# Patient Record
Sex: Female | Born: 1985 | Race: Black or African American | Hispanic: No | Marital: Married | Smoking: Never smoker
Health system: Southern US, Community
[De-identification: ages and names within clinical notes are randomized; demographics above are authoritative.]

## PROBLEM LIST (undated history)

## (undated) ENCOUNTER — Inpatient Hospital Stay (HOSPITAL_COMMUNITY): Payer: Self-pay

## (undated) DIAGNOSIS — R87629 Unspecified abnormal cytological findings in specimens from vagina: Secondary | ICD-10-CM

## (undated) DIAGNOSIS — Z789 Other specified health status: Secondary | ICD-10-CM

## (undated) DIAGNOSIS — IMO0002 Reserved for concepts with insufficient information to code with codable children: Secondary | ICD-10-CM

## (undated) DIAGNOSIS — N390 Urinary tract infection, site not specified: Secondary | ICD-10-CM

## (undated) DIAGNOSIS — R87619 Unspecified abnormal cytological findings in specimens from cervix uteri: Secondary | ICD-10-CM

---

## 2007-04-26 ENCOUNTER — Emergency Department (HOSPITAL_COMMUNITY): Admission: EM | Admit: 2007-04-26 | Discharge: 2007-04-27 | Payer: Self-pay | Admitting: Emergency Medicine

## 2008-05-19 ENCOUNTER — Inpatient Hospital Stay (HOSPITAL_COMMUNITY): Admission: AD | Admit: 2008-05-19 | Discharge: 2008-05-19 | Payer: Self-pay | Admitting: Obstetrics

## 2008-05-23 ENCOUNTER — Inpatient Hospital Stay (HOSPITAL_COMMUNITY): Admission: AD | Admit: 2008-05-23 | Discharge: 2008-05-23 | Payer: Self-pay | Admitting: Obstetrics

## 2008-06-11 ENCOUNTER — Inpatient Hospital Stay (HOSPITAL_COMMUNITY): Admission: RE | Admit: 2008-06-11 | Discharge: 2008-06-13 | Payer: Self-pay | Admitting: Obstetrics

## 2010-02-08 DIAGNOSIS — N390 Urinary tract infection, site not specified: Secondary | ICD-10-CM

## 2010-02-08 HISTORY — DX: Urinary tract infection, site not specified: N39.0

## 2010-05-19 LAB — CBC
HCT: 36.5 % (ref 36.0–46.0)
Hemoglobin: 12.4 g/dL (ref 12.0–15.0)
MCHC: 34.1 g/dL (ref 30.0–36.0)
MCHC: 34.1 g/dL (ref 30.0–36.0)
MCV: 95.8 fL (ref 78.0–100.0)
MCV: 96 fL (ref 78.0–100.0)
Platelets: 196 10*3/uL (ref 150–400)
RDW: 14.1 % (ref 11.5–15.5)

## 2010-06-23 ENCOUNTER — Emergency Department (HOSPITAL_BASED_OUTPATIENT_CLINIC_OR_DEPARTMENT_OTHER)
Admission: EM | Admit: 2010-06-23 | Discharge: 2010-06-23 | Disposition: A | Discharge status: Home / Self Care | Payer: 59 | Attending: Emergency Medicine | Admitting: Emergency Medicine

## 2010-06-23 ENCOUNTER — Emergency Department (INDEPENDENT_AMBULATORY_CARE_PROVIDER_SITE_OTHER): Payer: 59

## 2010-06-23 DIAGNOSIS — N390 Urinary tract infection, site not specified: Secondary | ICD-10-CM | POA: Insufficient documentation

## 2010-06-23 DIAGNOSIS — R1032 Left lower quadrant pain: Secondary | ICD-10-CM

## 2010-06-23 DIAGNOSIS — O2390 Unspecified genitourinary tract infection in pregnancy, unspecified trimester: Secondary | ICD-10-CM | POA: Insufficient documentation

## 2010-06-23 DIAGNOSIS — O2690 Pregnancy related conditions, unspecified, unspecified trimester: Secondary | ICD-10-CM | POA: Insufficient documentation

## 2010-06-23 LAB — CBC
HCT: 33.2 % — ABNORMAL LOW (ref 36.0–46.0)
Hemoglobin: 11.5 g/dL — ABNORMAL LOW (ref 12.0–15.0)
MCHC: 34.6 g/dL (ref 30.0–36.0)
WBC: 6.2 10*3/uL (ref 4.0–10.5)

## 2010-06-23 LAB — WET PREP, GENITAL: Yeast Wet Prep HPF POC: NONE SEEN

## 2010-06-23 LAB — URINALYSIS, ROUTINE W REFLEX MICROSCOPIC
Glucose, UA: NEGATIVE mg/dL
Specific Gravity, Urine: 1.011 (ref 1.005–1.030)
pH: 6.5 (ref 5.0–8.0)

## 2010-06-23 LAB — DIFFERENTIAL
Basophils Relative: 1 % (ref 0–1)
Eosinophils Relative: 2 % (ref 0–5)
Monocytes Absolute: 0.6 10*3/uL (ref 0.1–1.0)
Neutrophils Relative %: 69 % (ref 43–77)

## 2010-06-23 LAB — URINE MICROSCOPIC-ADD ON

## 2010-06-23 LAB — BASIC METABOLIC PANEL
CO2: 23 mEq/L (ref 19–32)
Chloride: 99 mEq/L (ref 96–112)
Creatinine, Ser: 0.7 mg/dL (ref 0.4–1.2)
GFR calc Af Amer: >60 mL/min (ref >60)

## 2010-06-24 LAB — URINE CULTURE
Colony Count: 8000
Culture  Setup Time: 201205151704

## 2010-08-04 ENCOUNTER — Other Ambulatory Visit (HOSPITAL_COMMUNITY): Payer: Self-pay | Admitting: Obstetrics

## 2010-08-04 LAB — STREP B DNA PROBE: GBS: NEGATIVE

## 2010-08-05 ENCOUNTER — Other Ambulatory Visit (HOSPITAL_COMMUNITY): Payer: Self-pay | Admitting: Obstetrics

## 2010-08-05 DIAGNOSIS — O365990 Maternal care for other known or suspected poor fetal growth, unspecified trimester, not applicable or unspecified: Secondary | ICD-10-CM

## 2010-08-07 ENCOUNTER — Encounter (HOSPITAL_COMMUNITY): Payer: Self-pay

## 2010-08-07 ENCOUNTER — Other Ambulatory Visit (HOSPITAL_COMMUNITY): Payer: Self-pay | Admitting: Obstetrics

## 2010-08-07 ENCOUNTER — Ambulatory Visit (HOSPITAL_COMMUNITY)
Admission: RE | Admit: 2010-08-07 | Discharge: 2010-08-07 | Disposition: A | Discharge status: Home / Self Care | Payer: 59 | Source: Ambulatory Visit | Attending: Obstetrics | Admitting: Obstetrics

## 2010-08-07 DIAGNOSIS — Z3689 Encounter for other specified antenatal screening: Secondary | ICD-10-CM | POA: Insufficient documentation

## 2010-08-07 DIAGNOSIS — O2690 Pregnancy related conditions, unspecified, unspecified trimester: Secondary | ICD-10-CM

## 2010-08-07 DIAGNOSIS — O365990 Maternal care for other known or suspected poor fetal growth, unspecified trimester, not applicable or unspecified: Secondary | ICD-10-CM | POA: Insufficient documentation

## 2010-08-16 ENCOUNTER — Inpatient Hospital Stay (HOSPITAL_COMMUNITY): Admission: AD | Admit: 2010-08-16 | Payer: 59 | Source: Ambulatory Visit | Admitting: Obstetrics

## 2010-08-16 ENCOUNTER — Inpatient Hospital Stay (HOSPITAL_COMMUNITY)
Admission: AD | Admit: 2010-08-16 | Discharge: 2010-08-16 | Disposition: A | Discharge status: Home Health | Payer: 59 | Source: Ambulatory Visit | Attending: Obstetrics | Admitting: Obstetrics

## 2010-08-16 ENCOUNTER — Encounter (HOSPITAL_COMMUNITY): Payer: Self-pay | Admitting: *Deleted

## 2010-08-16 DIAGNOSIS — O479 False labor, unspecified: Principal | ICD-10-CM | POA: Insufficient documentation

## 2010-08-16 HISTORY — DX: Unspecified abnormal cytological findings in specimens from cervix uteri: R87.619

## 2010-08-16 HISTORY — DX: Reserved for concepts with insufficient information to code with codable children: IMO0002

## 2010-08-16 HISTORY — DX: Urinary tract infection, site not specified: N39.0

## 2010-08-16 LAB — RPR: RPR: NONREACTIVE

## 2010-08-16 LAB — TYPE AND SCREEN: Antibody Screen: NEGATIVE

## 2010-08-25 ENCOUNTER — Inpatient Hospital Stay (HOSPITAL_COMMUNITY)
Admission: AD | Admit: 2010-08-25 | Discharge: 2010-08-27 | DRG: 775 | Disposition: A | Discharge status: Home / Self Care | Payer: 59 | Source: Ambulatory Visit | Attending: Obstetrics | Admitting: Obstetrics

## 2010-08-25 ENCOUNTER — Encounter (HOSPITAL_COMMUNITY): Payer: Self-pay | Admitting: Anesthesiology

## 2010-08-25 ENCOUNTER — Encounter (HOSPITAL_COMMUNITY): Payer: Self-pay

## 2010-08-25 ENCOUNTER — Inpatient Hospital Stay (HOSPITAL_COMMUNITY): Payer: 59 | Admitting: Anesthesiology

## 2010-08-25 DIAGNOSIS — O99892 Other specified diseases and conditions complicating childbirth: Principal | ICD-10-CM | POA: Diagnosis present

## 2010-08-25 DIAGNOSIS — Z22330 Carrier of Group B streptococcus: Secondary | ICD-10-CM

## 2010-08-25 LAB — CBC
MCHC: 33.2 g/dL (ref 30.0–36.0)
Platelets: 185 10*3/uL (ref 150–400)
RDW: 13.1 % (ref 11.5–15.5)
WBC: 7.3 10*3/uL (ref 4.0–10.5)

## 2010-08-25 LAB — RPR: RPR Ser Ql: NONREACTIVE

## 2010-08-25 MED ORDER — CITRIC ACID-SODIUM CITRATE 334-500 MG/5ML PO SOLN: 30.0000 mL | ORAL | Status: DC | PRN

## 2010-08-25 MED ORDER — TERBUTALINE SULFATE 1 MG/ML IJ SOLN: 0.2500 mg | Freq: Once | INTRAMUSCULAR | Status: DC | PRN

## 2010-08-25 MED ORDER — IBUPROFEN 600 MG PO TABS: 600.0000 mg | ORAL_TABLET | Freq: Four times a day (QID) | ORAL | Status: DC | PRN

## 2010-08-25 MED ORDER — FENTANYL 2.5 MCG/ML BUPIVACAINE 1/10 % EPIDURAL INFUSION (WH - ANES)
14.0000 mL/h | INTRAMUSCULAR | Status: DC
  Filled 2010-08-25 (×2): qty 60

## 2010-08-25 MED ORDER — FENTANYL 2.5 MCG/ML BUPIVACAINE 1/10 % EPIDURAL INFUSION (WH - ANES)
INTRAMUSCULAR | Status: DC | PRN
  Administered 2010-08-25: 14 mL/h
  Administered 2010-08-25: 14 mL/h via EPIDURAL

## 2010-08-25 MED ORDER — EPHEDRINE 5 MG/ML INJ: 10.0000 mg | INTRAVENOUS | Status: DC | PRN

## 2010-08-25 MED ORDER — DIPHENHYDRAMINE HCL 50 MG/ML IJ SOLN: 12.5000 mg | INTRAMUSCULAR | Status: DC | PRN

## 2010-08-25 MED ORDER — PHENYLEPHRINE 40 MCG/ML (10ML) SYRINGE FOR IV PUSH (FOR BLOOD PRESSURE SUPPORT)
80.0000 ug | PREFILLED_SYRINGE | INTRAVENOUS | Status: DC | PRN
  Filled 2010-08-25: qty 5

## 2010-08-25 MED ORDER — EPHEDRINE 5 MG/ML INJ
10.0000 mg | INTRAVENOUS | Status: DC | PRN
  Filled 2010-08-25: qty 4

## 2010-08-25 MED ORDER — LACTATED RINGERS IV SOLN
500.0000 mL | Freq: Once | INTRAVENOUS | Status: AC
  Administered 2010-08-25: 500 mL via INTRAVENOUS

## 2010-08-25 MED ORDER — OXYCODONE-ACETAMINOPHEN 5-325 MG PO TABS: 2.0000 | ORAL_TABLET | ORAL | Status: DC | PRN

## 2010-08-25 MED ORDER — OXYTOCIN 20 UNITS IN LACTATED RINGERS INFUSION - SIMPLE
125.0000 mL/h | Freq: Once | INTRAVENOUS | Status: DC
  Filled 2010-08-25: qty 1000

## 2010-08-25 MED ORDER — NALBUPHINE HCL 10 MG/ML IJ SOLN: 10.0000 mg | INTRAMUSCULAR | Status: DC | PRN

## 2010-08-25 MED ORDER — ACETAMINOPHEN 325 MG PO TABS: 650.0000 mg | ORAL_TABLET | ORAL | Status: DC | PRN

## 2010-08-25 MED ORDER — LACTATED RINGERS IV SOLN
INTRAVENOUS | Status: DC
  Administered 2010-08-25: 21:00:00 via INTRAVENOUS

## 2010-08-25 MED ORDER — PHENYLEPHRINE 40 MCG/ML (10ML) SYRINGE FOR IV PUSH (FOR BLOOD PRESSURE SUPPORT): 80.0000 ug | PREFILLED_SYRINGE | INTRAVENOUS | Status: DC | PRN

## 2010-08-25 MED ORDER — FLEET ENEMA 7-19 GM/118ML RE ENEM: 1.0000 | ENEMA | RECTAL | Status: DC | PRN

## 2010-08-25 MED ORDER — ONDANSETRON HCL 4 MG/2ML IJ SOLN: 4.0000 mg | Freq: Four times a day (QID) | INTRAMUSCULAR | Status: DC | PRN

## 2010-08-25 MED ORDER — LACTATED RINGERS IV SOLN: 500.0000 mL | INTRAVENOUS | Status: DC | PRN

## 2010-08-25 MED ORDER — LIDOCAINE HCL (PF) 1 % IJ SOLN
30.0000 mL | INTRAMUSCULAR | Status: DC | PRN
  Filled 2010-08-25: qty 30

## 2010-08-25 MED ORDER — OXYTOCIN 20 UNITS IN LACTATED RINGERS INFUSION - SIMPLE
1.0000 m[IU]/min | INTRAVENOUS | Status: DC
  Administered 2010-08-25: 1 m[IU]/min via INTRAVENOUS
  Filled 2010-08-25: qty 1000

## 2010-08-25 NOTE — Progress Notes (Signed)
Pt states she has been having contractions every 5-7, has been up all night. Pt states she has leaking of thick white fluid for a while.

## 2010-08-25 NOTE — Progress Notes (Signed)
Pt states ctx started last night & were 10-15 minutes apart. States they have been 5-7 minutes about this morning. Denies loss of fluid or vaginal bleeding.

## 2010-08-25 NOTE — H&P (Signed)
This is Dr. Lewis Moccasin dictating the history and physical on Cynthia Berg She's a gravida 3 para 2004 [facility] 09/11/2010 she has no known allergies she was admitted in labor negative GBS does be positive Cervix is normal at 3 cm 80% vertex -1 Membranes were ruptured artificially and the fluid was clear Past medical history negative Social history denies smoking drinking or alcohol abuse Family history noncontributory System review noncontributory Physical exam HEENT negative breasts negative Breasts negative Heart regular rhythm no murmurs no gallops Lungs clear to P&A Abdomen term Pelvic as described above Extremities negative

## 2010-08-25 NOTE — Anesthesia Preprocedure Evaluation (Signed)
Anesthesia Evaluation  Name, MR# and DOB Patient awake  General Assessment Comment  Reviewed: Allergy & Precautions, H&P , Patient's Chart, lab work & pertinent test results and reviewed documented beta blocker date and time   Airway Mallampati: I TM Distance: >3 FB Neck ROM: full    Dental  (+) Teeth Intact   Pulmonaryneg pulmonary ROS    clear to auscultation  pulmonary exam normal   Cardiovascular regular Normal   Neuro/PsychNegative Neurological ROS Negative Psych ROS  GI/Hepatic/Renal negative GI ROS, negative Liver ROS, and negative Renal ROS (+)       Endo/Other  Negative Endocrine ROS (+)   Abdominal   Musculoskeletal  Hematology negative hematology ROS (+)   Peds  Reproductive/Obstetrics (+) Pregnancy   Anesthesia Other Findings             Anesthesia Physical Anesthesia Plan  ASA: II  Anesthesia Plan: Epidural   Post-op Pain Management:    Induction:   Airway Management Planned:   Additional Equipment:   Intra-op Plan:   Post-operative Plan:   Informed Consent: I have reviewed the patients History and Physical, chart, labs and discussed the procedure including the risks, benefits and alternatives for the proposed anesthesia with the patient or authorized representative who has indicated his/her understanding and acceptance.   History available from chart only  Plan Discussed with:   Anesthesia Plan Comments:         Anesthesia Quick Evaluation

## 2010-08-25 NOTE — Progress Notes (Signed)
Notified of admission to MAU & SVE. Orders received for admission to birthing suites.

## 2010-08-25 NOTE — Anesthesia Procedure Notes (Addendum)
Epidural Patient location during procedure: OB Start time: 08/25/2010 5:40 PM End time: 08/25/2010 5:52 PM Reason for block: procedure for pain  Staffing Anesthesiologist: Sandrea Hughs Performed by: anesthesiologist   Preanesthetic Checklist Completed: patient identified, site marked, surgical consent, pre-op evaluation, timeout performed, IV checked, risks and benefits discussed and monitors and equipment checked  Epidural Patient position: sitting Prep: DuraPrep Patient monitoring: continuous pulse ox and blood pressure Approach: midline Injection technique: LOR air  Needle:  Needle type: Tuohy  Needle gauge: 17 G Needle length: 9 cm Needle insertion depth: 4 cm Catheter type: closed end flexible Catheter size: 19 Gauge Catheter at skin depth: 10 cm Test dose: negative  Assessment Sensory level: T8 Events: blood not aspirated, injection not painful, no injection resistance, negative IV test and no paresthesia  Additional Notes Pt comfortable. See nursing notes for VS and FHR

## 2010-08-26 LAB — CBC
HCT: 35.8 % — ABNORMAL LOW (ref 36.0–46.0)
Hemoglobin: 12 g/dL (ref 12.0–15.0)
WBC: 12.3 10*3/uL — ABNORMAL HIGH (ref 4.0–10.5)

## 2010-08-26 MED ORDER — OXYTOCIN 20 UNITS IN LACTATED RINGERS INFUSION - SIMPLE: 125.0000 mL/h | INTRAVENOUS | Status: DC | PRN

## 2010-08-26 MED ORDER — WITCH HAZEL-GLYCERIN EX PADS: MEDICATED_PAD | CUTANEOUS | Status: DC | PRN

## 2010-08-26 MED ORDER — ONDANSETRON HCL 4 MG/2ML IJ SOLN: 4.0000 mg | INTRAMUSCULAR | Status: DC | PRN

## 2010-08-26 MED ORDER — SODIUM CHLORIDE 0.9 % IV SOLN: 250.0000 mL | INTRAVENOUS | Status: DC

## 2010-08-26 MED ORDER — SODIUM CHLORIDE 0.9 % IJ SOLN: 3.0000 mL | Freq: Two times a day (BID) | INTRAMUSCULAR | Status: DC

## 2010-08-26 MED ORDER — FERROUS SULFATE 325 (65 FE) MG PO TABS
325.0000 mg | ORAL_TABLET | Freq: Two times a day (BID) | ORAL | Status: DC
  Administered 2010-08-26 – 2010-08-27 (×2): 325 mg via ORAL
  Filled 2010-08-26 (×2): qty 1

## 2010-08-26 MED ORDER — PRENATAL PLUS 27-1 MG PO TABS
1.0000 | ORAL_TABLET | Freq: Every day | ORAL | Status: DC
Start: 2010-08-26 — End: 2010-08-27
  Administered 2010-08-26 – 2010-08-27 (×2): 1 via ORAL
  Filled 2010-08-26 (×2): qty 1

## 2010-08-26 MED ORDER — BENZOCAINE-MENTHOL 20-0.5 % EX AERO
1.0000 "application " | INHALATION_SPRAY | CUTANEOUS | Status: DC | PRN
  Administered 2010-08-26: 1 via TOPICAL

## 2010-08-26 MED ORDER — IBUPROFEN 600 MG PO TABS
600.0000 mg | ORAL_TABLET | Freq: Four times a day (QID) | ORAL | Status: DC
  Administered 2010-08-26 – 2010-08-27 (×6): 600 mg via ORAL
  Filled 2010-08-26 (×6): qty 1

## 2010-08-26 MED ORDER — ZOLPIDEM TARTRATE 5 MG PO TABS: 5.0000 mg | ORAL_TABLET | Freq: Every evening | ORAL | Status: DC | PRN

## 2010-08-26 MED ORDER — DIPHENHYDRAMINE HCL 25 MG PO CAPS: 25.0000 mg | ORAL_CAPSULE | Freq: Four times a day (QID) | ORAL | Status: DC | PRN

## 2010-08-26 MED ORDER — LANOLIN HYDROUS EX OINT: TOPICAL_OINTMENT | CUTANEOUS | Status: DC | PRN

## 2010-08-26 MED ORDER — SODIUM CHLORIDE 0.9 % IJ SOLN: 3.0000 mL | INTRAMUSCULAR | Status: DC | PRN

## 2010-08-26 MED ORDER — BENZOCAINE-MENTHOL 20-0.5 % EX AERO
INHALATION_SPRAY | CUTANEOUS | Status: AC
  Administered 2010-08-26: 1 via TOPICAL
  Filled 2010-08-26: qty 56

## 2010-08-26 MED ORDER — TETANUS-DIPHTH-ACELL PERTUSSIS 5-2.5-18.5 LF-MCG/0.5 IM SUSP
0.5000 mL | Freq: Once | INTRAMUSCULAR | Status: AC
  Administered 2010-08-26: 0.5 mL via INTRAMUSCULAR
  Filled 2010-08-26: qty 0.5

## 2010-08-26 MED ORDER — SENNOSIDES-DOCUSATE SODIUM 8.6-50 MG PO TABS
1.0000 | ORAL_TABLET | Freq: Every day | ORAL | Status: DC
  Administered 2010-08-26: 2 via ORAL

## 2010-08-26 MED ORDER — SIMETHICONE 80 MG PO CHEW: 80.0000 mg | CHEWABLE_TABLET | ORAL | Status: DC | PRN

## 2010-08-26 MED ORDER — OXYCODONE-ACETAMINOPHEN 5-325 MG PO TABS
1.0000 | ORAL_TABLET | ORAL | Status: DC | PRN
  Administered 2010-08-26 (×3): 1 via ORAL
  Filled 2010-08-26 (×3): qty 1

## 2010-08-26 MED ORDER — ONDANSETRON HCL 4 MG PO TABS: 4.0000 mg | ORAL_TABLET | ORAL | Status: DC | PRN

## 2010-08-26 NOTE — Progress Notes (Signed)
  Postpartum day one Vital signs normal Fundus firm Lochia moderate Legs negative No complaints 

## 2010-08-26 NOTE — Progress Notes (Signed)
BREASTFEEDING CONSULTATION SERVICES INFORMATION GIVEN TO PATIENT.  PATIENT BREASTFEEDING WITH LATCH SCORE 9 OBSERVED.  REVIEWED BASICS. PATIENT STATES SHE IS WORRIED SHE DOESN'T HAVE ANY MILK BECAUSE NONE SEEN WITH ELECTRIC BREAST PUMP.  REASSURANCE GIVEN.  DISCOURAGED USE OF PUMP AT THIS TIME BECAUSE NEWBORN IN NURSING SO WELL.  ENCOURAGED TO CALL WITH QUESTIONS OR CONCERNS.

## 2010-08-26 NOTE — Anesthesia Postprocedure Evaluation (Signed)
Vital signs stable Patient alert Pain and nausea are controlled No apparent anesthetic complications No follow up care needed Pt may be d/c when NM fxn of LE returns

## 2010-08-27 MED ORDER — IBUPROFEN 600 MG PO TABS: 600.0000 mg | ORAL_TABLET | Freq: Four times a day (QID) | ORAL | Status: AC

## 2010-08-27 NOTE — Discharge Summary (Signed)
Obstetric Discharge Summary Reason for Admission: onset of labor Prenatal Procedures: ultrasound Intrapartum Procedures: spontaneous vaginal delivery Postpartum Procedures: none Complications-Operative and Postpartum: none  Hemoglobin  Date Value Range Status  08/26/2010 12.0  12.0-15.0 (g/dL) Final     HCT  Date Value Range Status  08/26/2010 35.8* 36.0-46.0 (%) Final    Discharge Diagnoses: Term Pregnancy-delivered  Discharge Information: Date: 08/27/2010 Activity: unrestricted Diet: routine Medications: PNV and Ibuprophen Condition: stable Instructions: refer to practice specific booklet Discharge to: home Follow-up Information    Follow up with MARSHALL,BERNARD Berg. Make an appointment in 6 weeks.   Contact information:   [address] [place] [address] [phone]          Newborn Data: Live born  Information for the patient's newborn:  Cynthia, Engen Berg [[id number]]  female ; APGAR 9 - 9, ; weight ;  2761 [address]ams Home with mother.  Cynthia Berg 08/27/2010, 9:40 AM

## 2010-08-27 NOTE — Progress Notes (Signed)
Post Partum Day 2 Subjective: no complaints  Objective: Blood pressure 110/71, pulse 62, temperature 97.8 F (36.6 C), temperature source Oral, resp. rate 18, height 5\' 9"  (1.753 m), weight 72.122 kg (159 lb), SpO2 98.00%, unknown if currently breastfeeding.  Physical Exam:  General: alert Lochia: appropriate Uterine Fundus: firm Incision: healing well DVT Evaluation: No evidence of DVT seen on physical exam.   Basename 08/26/10 0515 08/25/10 1120  HGB 12.0 12.0  HCT 35.8* 36.1    Assessment/Plan: Discharge home   LOS: 2 days   Zerrick Hanssen A 08/27/2010, 9:38 AM

## 2010-08-27 NOTE — Progress Notes (Signed)
Breastfeeding  Going well infant just unlatched after .. Discussed reivewed engorgement tx if needed ,has a pump .

## 2010-12-03 ENCOUNTER — Ambulatory Visit (HOSPITAL_COMMUNITY)
Admission: RE | Admit: 2010-12-03 | Discharge: 2010-12-03 | Disposition: A | Discharge status: Home / Self Care | Payer: 59 | Source: Ambulatory Visit | Attending: Obstetrics | Admitting: Obstetrics

## 2010-12-03 NOTE — Progress Notes (Addendum)
Adult Lactation Consultation Outpatient Visit Note  Patient Name: Cynthia Berg Date of Birth: March 07, 1985  :  Mom comes in today for outpatient appointment in Lactation complaining of pain in her right nipple that started 3 weeks ago, her infant is 62 months old. She reports shooting pain in the nipple that travels into the breast primarily during and after feedings.  She states it is worse at night time.  Baby is gaining weight well, more than doubled his birth weight at 3 months.  Latch appears normal causing no discomfort.      Consultation Evaluation: Both nipples without trauma to them, no bruises, no blisters, no cracks.  Right nipple skin intact, but noted to be bright pink at the center.  Left nipple is normal brown skin color and doesn't burn or sting.  No history of antibiotics, gestational diabetes, steroid use.  Mother does state she had a vaginal yeast infection during her last trimester.  Baby's mouth clear, no diaper rash, but does pop on and off the breast more in the last 3 weeks. Discussed treating it like it is yeast.  Gave her a Audree Camel handout of Dr. Yevette Edwards protocol.  Basic yeast/thrush teaching handout given also.    No follow up needed, just prn as needed.   Judee Clara 12/03/2010, 5:03 PM   For comfort, gave mother Sore Nipple Breast Shells with instructions.  Mother does report her breast pads and bras do stay wet from leaking.  Talked about importance of drying her nipples to break the yeast cycle.

## 2010-12-31 ENCOUNTER — Emergency Department (HOSPITAL_COMMUNITY)
Admission: EM | Admit: 2010-12-31 | Discharge: 2011-01-01 | Disposition: A | Discharge status: Home / Self Care | Payer: 59 | Attending: Emergency Medicine | Admitting: Emergency Medicine

## 2010-12-31 ENCOUNTER — Encounter (HOSPITAL_COMMUNITY): Payer: Self-pay | Admitting: Emergency Medicine

## 2010-12-31 DIAGNOSIS — X58XXXA Exposure to other specified factors, initial encounter: Secondary | ICD-10-CM | POA: Insufficient documentation

## 2010-12-31 DIAGNOSIS — S8010XA Contusion of unspecified lower leg, initial encounter: Secondary | ICD-10-CM | POA: Insufficient documentation

## 2010-12-31 DIAGNOSIS — T148XXA Other injury of unspecified body region, initial encounter: Secondary | ICD-10-CM

## 2010-12-31 DIAGNOSIS — S9030XA Contusion of unspecified foot, initial encounter: Secondary | ICD-10-CM | POA: Insufficient documentation

## 2010-12-31 DIAGNOSIS — S7010XA Contusion of unspecified thigh, initial encounter: Secondary | ICD-10-CM | POA: Insufficient documentation

## 2010-12-31 DIAGNOSIS — M25476 Effusion, unspecified foot: Secondary | ICD-10-CM | POA: Insufficient documentation

## 2010-12-31 DIAGNOSIS — M25473 Effusion, unspecified ankle: Secondary | ICD-10-CM | POA: Insufficient documentation

## 2010-12-31 DIAGNOSIS — M79609 Pain in unspecified limb: Secondary | ICD-10-CM | POA: Insufficient documentation

## 2010-12-31 DIAGNOSIS — D649 Anemia, unspecified: Secondary | ICD-10-CM

## 2010-12-31 NOTE — ED Provider Notes (Signed)
History     CSN: [id number] Arrival date & time: 12/31/2010 11:24 PM   First MD Initiated Contact with Patient 12/31/10 2349      Chief Complaint  Patient presents with  . Leg Swelling    (Consider location/radiation/quality/duration/timing/severity/associated sxs/prior treatment) HPI Patient presents with complaint of bilateral lower extremity pain and bruising. She states she noticed small areas of bruising on her shin approximately 3 days ago. She states that her ankle is now swollen and the bruises have increased as well as developing a bruise on her posterior left thigh. She denies any trauma or falls. She denies any recent illnesses or fevers. She denies any history of easy bruising or bleeding in the past. She has had a C-section in 2 vaginal deliveries with no bleeding difficulties. Pain in legs is worse with palpation. Both legs are affected equally. There are no other associated symptoms. Nothing makes the symptoms improved. She has had no similar prior symptoms. Past Medical History  Diagnosis Date  . Abnormal Pap smear   . UTI (urinary tract infection) 2012  . No pertinent past medical history   . NVD (normal vaginal delivery) 08/25/2010    Past Surgical History  Procedure Date  . Cesarean section   . Cesarean section     Family History  Problem Relation Age of Onset  . Cancer Mother     History  Substance Use Topics  . Smoking status: Never Smoker   . Smokeless tobacco: Never Used  . Alcohol Use: No    OB History    Grav Para Term Preterm Abortions TAB SAB Ect Mult Living   3 3 1       3       Review of Systems ROS reviewed and otherwise negative except for mentioned in HPI  Allergies  Review of patient's allergies indicates no known allergies.  Home Medications   Current Outpatient Rx  Name Route Sig Dispense Refill  . IBUPROFEN 200 MG PO TABS Oral Take 800 mg by mouth every 6 (six) hours as needed. For pain     . PRENATAL PLUS 27-1 MG PO TABS  Oral Take 1 tablet by mouth daily.     Marland Kitchen FERROUS SULFATE 325 (65 FE) MG PO TABS Oral Take 1 tablet (325 mg total) by mouth 3 (three) times daily with meals. 30 tablet 0    BP 103/56  Pulse 58  Temp(Src) 98.4 F (36.9 C) (Oral)  Resp 18  SpO2 96%  Breastfeeding? Yes Vitals reviewed Physical Exam Physical Examination: General appearance - alert, well appearing, and in no distress Mental status - alert, oriented to person, place, and time Eyes - pupils equal and reactive, no scleral icterus, no conjunctival pallor Mouth - mucous membranes moist, pharynx normal without lesions Chest - clear to auscultation, no wheezes, rales or rhonchi, symmetric air entry Heart - normal rate, regular rhythm, normal S1, S2, no murmurs, rubs, clicks or gallops Abdomen - soft, nontender, nondistended, no masses or organomegaly Back exam - no midline tenderness, no CVA tenderness Neurological - alert, oriented, normal speech, no focal findings, strength 5/5 in extremities x 4, sensation intact in extremities Musculoskeletal - no joint tenderness, deformity, FROM of joints of lower extremities, no bony point tenderness Extremities - peripheral pulses normal, no pedal edema, no clubbing or cyanosis, no significant edema  Skin - ecchymosis noted on medial aspect of left foot, scattered small bruises over bilateral anterior tibia, approx 3cm ecchymosis on posterior left thigh, no petechiae, no bruising  or rash elsewhere  ED Course  Procedures (including critical care time)  Labs Reviewed  CBC - Abnormal; Notable for the following:    RBC 3.60 (*)    Hemoglobin 10.7 (*)    HCT 32.5 (*)    All other components within normal limits  APTT  PROTIME-INR  BASIC METABOLIC PANEL  URINALYSIS, ROUTINE W REFLEX MICROSCOPIC   No results found.   1. Bruising   2. Anemia       MDM  Patient presenting with bruises over her feet and lower extremities which have been present for 3-4 days. No significant edema or  asymmetry of her exam. No systemic symptoms, fever or vomiting. She has no known trauma. Laboratory evaluation is normal including normal platelets and coags. Patient is anemic and is taking vitamins with iron supplements. She is nontoxic and well-hydrated appearing on exam. There is no history of bleeding disorder. She was discharged with strict return precautions and is agreeable with this plan. She is to arrange followup with her primary doctor.        Ethelda Chick, MD 01/01/11 [phone]

## 2010-12-31 NOTE — ED Notes (Signed)
PT. REPORTS BILATERAL LEG PAIN / SWELLING AND NUMBNESS X 4 DAYS , DENIES INJURY OR FALL , NO FEVER OR CHILLS.

## 2011-01-01 LAB — BASIC METABOLIC PANEL
CO2: 28 mEq/L (ref 19–32)
GFR calc non Af Amer: >90 mL/min (ref >90)
Glucose, Bld: 89 mg/dL (ref 70–99)
Potassium: 3.8 mEq/L (ref 3.5–5.1)
Sodium: 139 mEq/L (ref 135–145)

## 2011-01-01 LAB — CBC
Hemoglobin: 10.7 g/dL — ABNORMAL LOW (ref 12.0–15.0)
MCH: 29.7 pg (ref 26.0–34.0)
MCV: 90.3 fL (ref 78.0–100.0)
RBC: 3.6 MIL/uL — ABNORMAL LOW (ref 3.87–5.11)

## 2011-01-01 LAB — URINALYSIS, ROUTINE W REFLEX MICROSCOPIC
Glucose, UA: NEGATIVE mg/dL
Hgb urine dipstick: NEGATIVE
Leukocytes, UA: NEGATIVE
Specific Gravity, Urine: 1.023 (ref 1.005–1.030)
Urobilinogen, UA: 1 mg/dL (ref 0.0–1.0)

## 2011-01-01 MED ORDER — FERROUS SULFATE 325 (65 FE) MG PO TABS: 325.0000 mg | ORAL_TABLET | Freq: Three times a day (TID) | ORAL | Status: DC

## 2011-01-01 NOTE — ED Notes (Signed)
MD at bedside. 

## 2011-01-01 NOTE — ED Notes (Signed)
Pt c/o pain bilateral lower extremities, and in her breast. Significant ecchymosis present in both legs. Skin cool

## 2012-12-19 IMAGING — US US OB LIMITED
1 series · 13 of 28 positions shown · non-contrast
Comparison: none

CLINICAL DATA: Lower quadrant and left flank pain with cramping for
2 days.  28 weeks estimated gestational age

[Series 1: us ob limited · 0.30mm/px · 13 of 46 slices shown]
[im 2/46]
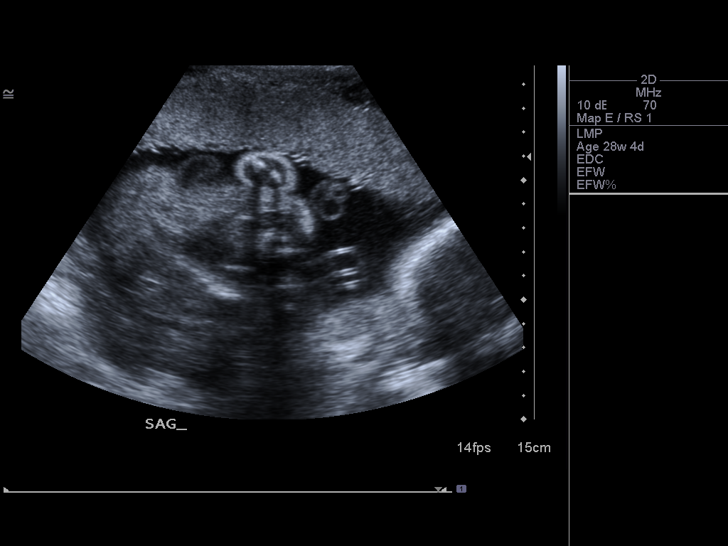
[im 6/46]
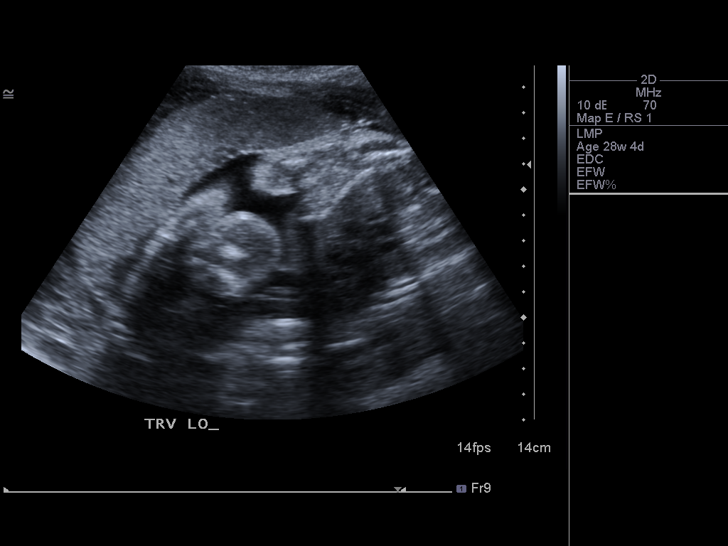
[im 9/46]
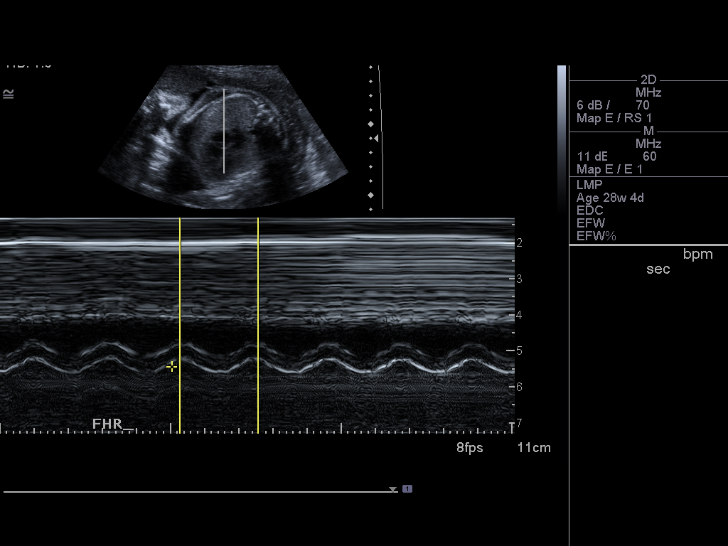
[im 12/46]
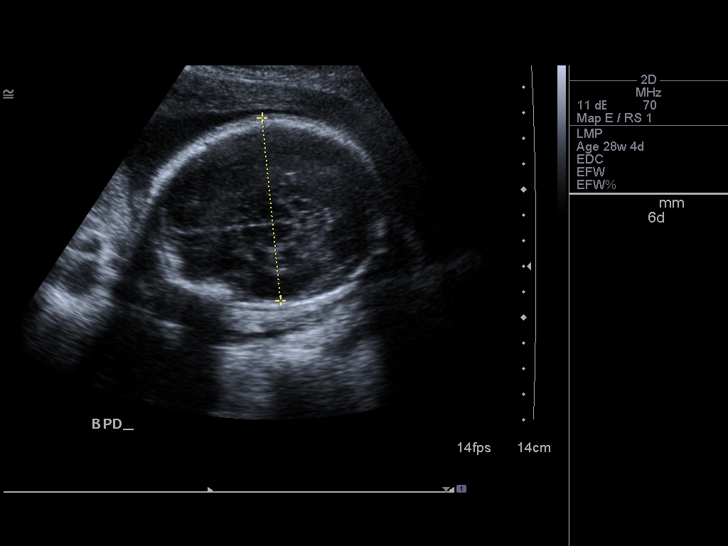
[im 16/46]
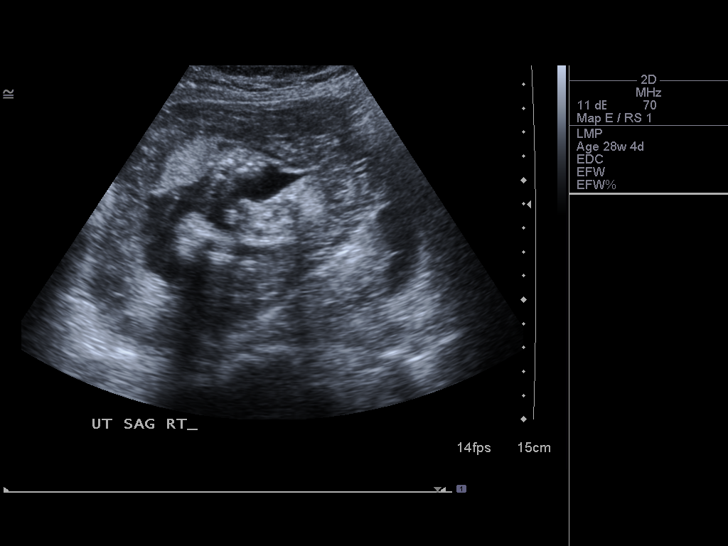
[im 19/46]
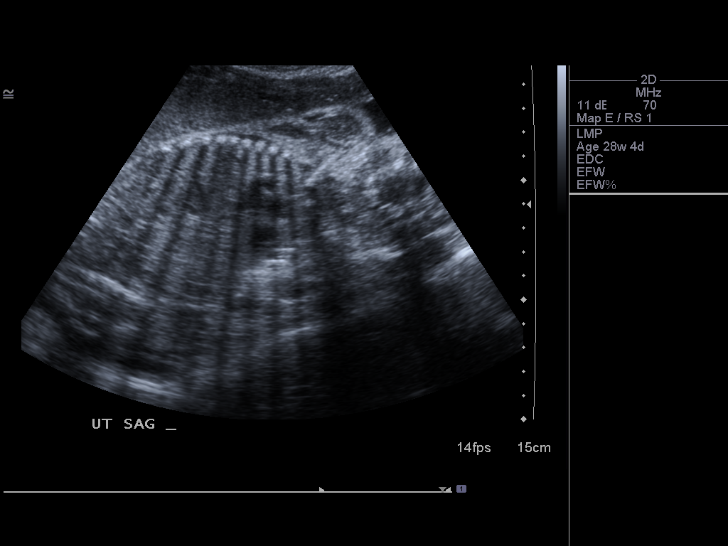
[im 24/46]
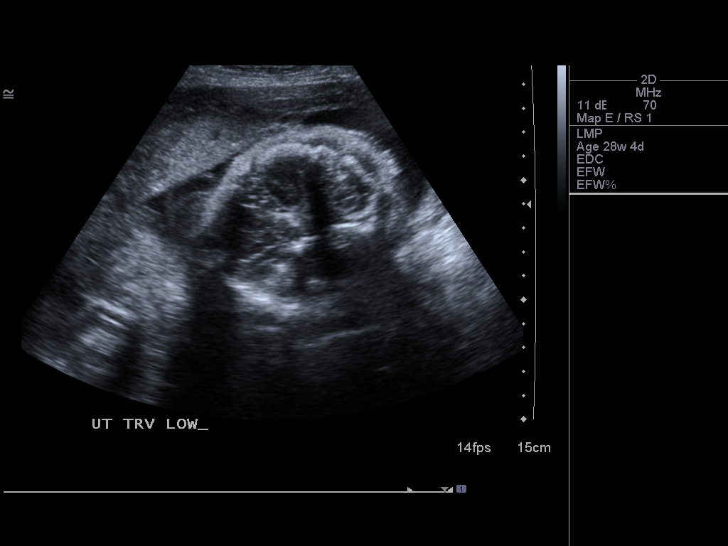
[im 27/46]
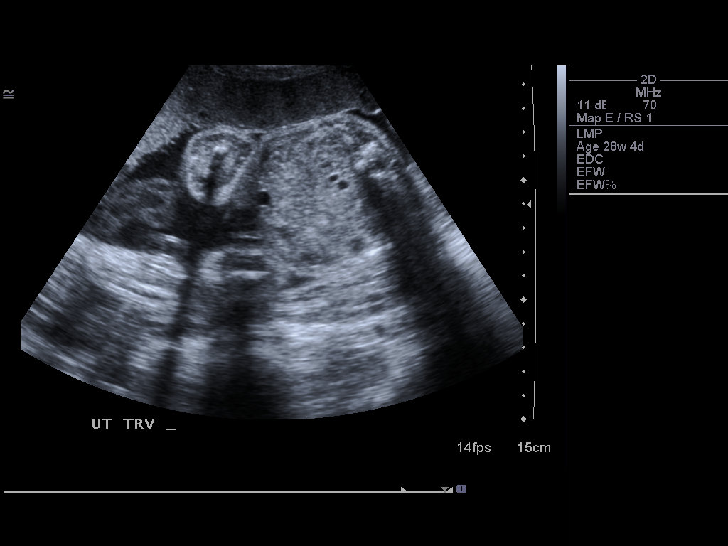
[im 31/46]
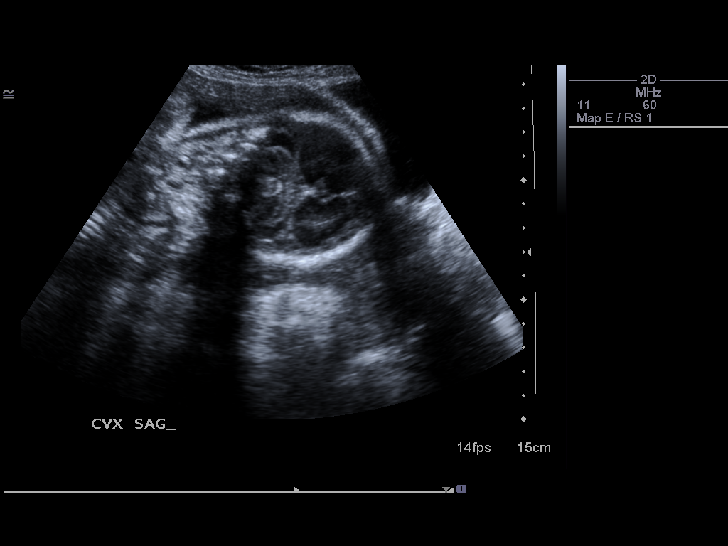
[im 34/46]
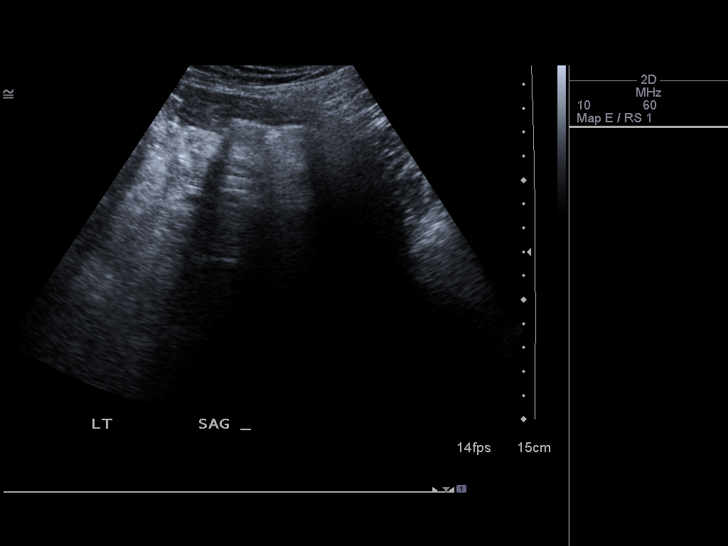
[im 37/46]
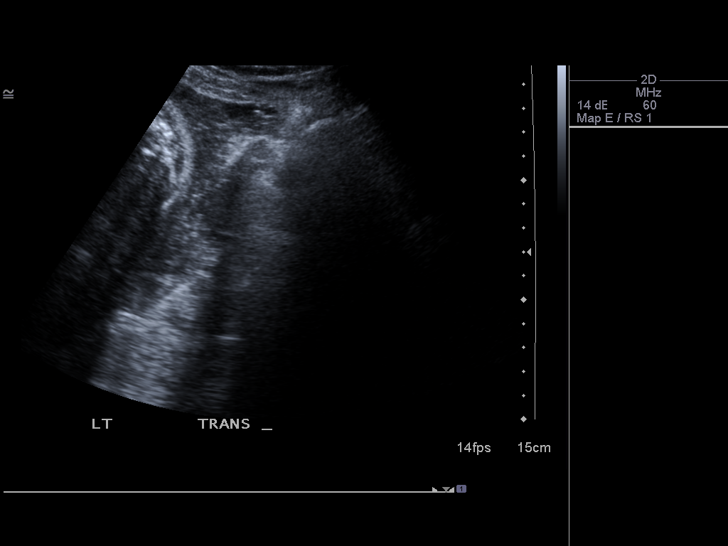
[im 41/46]
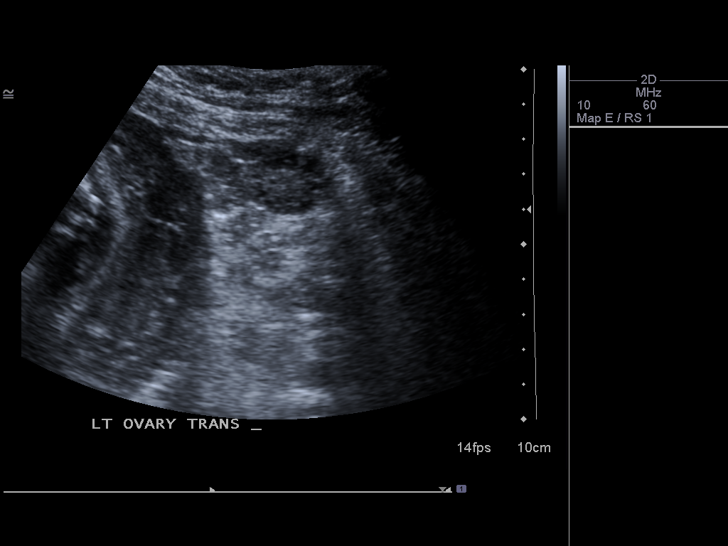
[im 44/46]
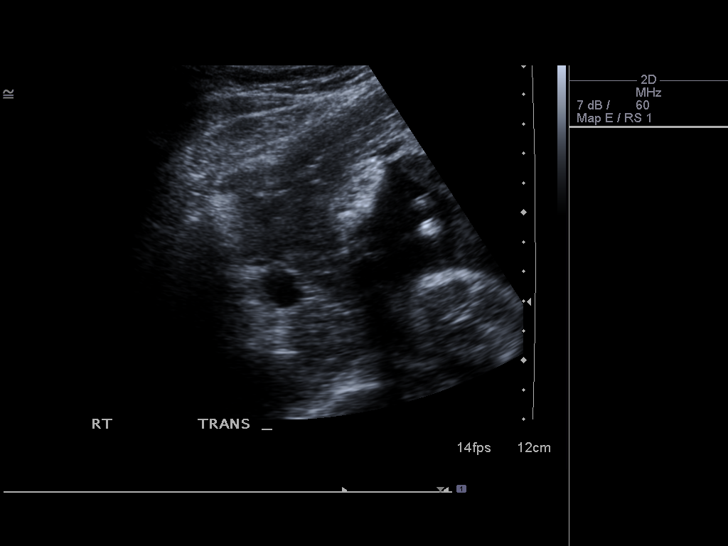

[13 of 28 positions shown; findings below may reference images not displayed]

LIMITED OBSTETRIC ULTRASOUND

Number of Fetuses: 1
Heart Rate: 484bpm
Movement: Seen
Presentation: Cephalic
Placental Location: Anterior
Previa: No
Amniotic Fluid (Subjective): Normal, AFI equals 13.4 cm (5% =
cm, 95% = 23.1 cm at 29 weeks)

BPD: 71.7cm   28w   5d

MATERNAL FINDINGS:
Cervix: 4.0, measured transabdominally/
Uterus/Adnexae: The left ovary appears normal.  The right ovary is
not visualized.

No focal placental abnormality is identified.
IMPRESSION: Single living intrauterine pregnancy demonstrating an estimated
gestational age by BPD alone of 28 weeks 5 days.  This correlates
well with expected estimated gestational age by LMP of 28 weeks 4
days and corresponding EDC of 09/11/2010.

Subjectively and quantitatively normal amniotic fluid volume.

Normal cervical length and appearance, measured transabdominally.

No focal placental abnormality.  Specifically no signs of abruption
are identified on the provided images of the placenta.

Recommend followup with non-emergent complete OB 14+ wk US
examination for fetal biometric evaluation and anatomic survey.
This could be performed at the [REDACTED].

## 2013-12-10 ENCOUNTER — Encounter (HOSPITAL_COMMUNITY): Payer: Self-pay | Admitting: Emergency Medicine

## 2014-10-23 ENCOUNTER — Encounter (HOSPITAL_COMMUNITY): Payer: Self-pay | Admitting: *Deleted

## 2014-10-23 ENCOUNTER — Inpatient Hospital Stay (HOSPITAL_COMMUNITY)
Admission: AD | Admit: 2014-10-23 | Discharge: 2014-10-23 | Disposition: A | Discharge status: Home / Self Care | Payer: Medicaid Other | Source: Ambulatory Visit | Attending: Obstetrics | Admitting: Obstetrics

## 2014-10-23 DIAGNOSIS — R102 Pelvic and perineal pain: Secondary | ICD-10-CM | POA: Diagnosis not present

## 2014-10-23 DIAGNOSIS — Z3A13 13 weeks gestation of pregnancy: Secondary | ICD-10-CM | POA: Insufficient documentation

## 2014-10-23 DIAGNOSIS — O26891 Other specified pregnancy related conditions, first trimester: Secondary | ICD-10-CM | POA: Diagnosis not present

## 2014-10-23 DIAGNOSIS — O26892 Other specified pregnancy related conditions, second trimester: Secondary | ICD-10-CM | POA: Diagnosis not present

## 2014-10-23 DIAGNOSIS — O4690 Antepartum hemorrhage, unspecified, unspecified trimester: Secondary | ICD-10-CM

## 2014-10-23 DIAGNOSIS — O4692 Antepartum hemorrhage, unspecified, second trimester: Secondary | ICD-10-CM

## 2014-10-23 HISTORY — DX: Other specified health status: Z78.9

## 2014-10-23 LAB — URINALYSIS, ROUTINE W REFLEX MICROSCOPIC
Bilirubin Urine: NEGATIVE
GLUCOSE, UA: NEGATIVE mg/dL
KETONES UR: NEGATIVE mg/dL
LEUKOCYTES UA: NEGATIVE
NITRITE: NEGATIVE
PROTEIN: NEGATIVE mg/dL
Specific Gravity, Urine: 1.02 (ref 1.005–1.030)
UROBILINOGEN UA: 0.2 mg/dL (ref 0.0–1.0)
pH: 6 (ref 5.0–8.0)

## 2014-10-23 LAB — WET PREP, GENITAL
CLUE CELLS WET PREP: NONE SEEN
TRICH WET PREP: NONE SEEN

## 2014-10-23 LAB — URINE MICROSCOPIC-ADD ON

## 2014-10-23 NOTE — Discharge Instructions (Signed)
Vaginal Bleeding During Pregnancy, First Trimester  A small amount of bleeding (spotting) from the vagina is relatively common in early pregnancy. It usually stops on its own. Various things may cause bleeding or spotting in early pregnancy. Some bleeding may be related to the pregnancy, and some may not. In most cases, the bleeding is normal and is not a problem. However, bleeding can also be a sign of something serious. Be sure to tell your health care provider about any vaginal bleeding right away.  Some possible causes of vaginal bleeding during the first trimester include:  · Infection or inflammation of the cervix.  · Growths (polyps) on the cervix.  · Miscarriage or threatened miscarriage.  · Pregnancy tissue has developed outside of the uterus and in a fallopian tube (tubal pregnancy).  · Tiny cysts have developed in the uterus instead of pregnancy tissue (molar pregnancy).  HOME CARE INSTRUCTIONS   Watch your condition for any changes. The following actions may help to lessen any discomfort you are feeling:  · Follow your health care provider's instructions for limiting your activity. If your health care provider orders bed rest, you may need to stay in bed and only get up to use the bathroom. However, your health care provider may allow you to continue light activity.  · If needed, make plans for someone to help with your regular activities and responsibilities while you are on bed rest.  · Keep track of the number of pads you use each day, how often you change pads, and how soaked (saturated) they are. Write this down.  · Do not use tampons. Do not douche.  · Do not have sexual intercourse or orgasms until approved by your health care provider.  · If you pass any tissue from your vagina, save the tissue so you can show it to your health care provider.  · Only take over-the-counter or prescription medicines as directed by your health care provider.  · Do not take aspirin because it can make you  bleed.  · Keep all follow-up appointments as directed by your health care provider.  SEEK MEDICAL CARE IF:  · You have any vaginal bleeding during any part of your pregnancy.  · You have cramps or labor pains.  · You have a fever, not controlled by medicine.  SEEK IMMEDIATE MEDICAL CARE IF:   · You have severe cramps in your back or belly (abdomen).  · You pass large clots or tissue from your vagina.  · Your bleeding increases.  · You feel light-headed or weak, or you have fainting episodes.  · You have chills.  · You are leaking fluid or have a gush of fluid from your vagina.  · You pass out while having a bowel movement.  MAKE SURE YOU:  · Understand these instructions.  · Will watch your condition.  · Will get help right away if you are not doing well or get worse.  Document Released: 11/04/2004 Document Revised: 01/30/2013 Document Reviewed: 10/02/2012  ExitCare® Patient Information ©2015 [employer]. This information is not intended to replace advice given to you by your health care provider. Make sure you discuss any questions you have with your health care provider.

## 2014-10-23 NOTE — MAU Note (Signed)
Pt reports she had a positive  HPT last week. Started having shap pain in her vaginal and pelvic area on Monday. Stated she had some brown discharge when she wiped yesterday.

## 2014-10-23 NOTE — MAU Provider Note (Signed)
History     CSN: [id number]  Arrival date and time: 10/23/14 1100   First Provider Initiated Contact with Patient 10/23/14 1133      Chief Complaint  Patient presents with  . Pelvic Pain   This is a 29 y.o. female at 106w0d by LMP who presents with c/o sharp pains in lower pelvis which shoot to vagina, intermittent, happened the other day but less so today. Also had some brown discharge. Has an appointment with Dr Gaynell Face next week for new OB.  Wanted to make sure the baby was OK   Patient is a 29 y.o. female presenting with abdominal pain. The history is provided by the patient.  Abdominal Pain The primary symptoms of the illness include abdominal pain and vaginal bleeding (brown). The primary symptoms of the illness do not include fever, fatigue, nausea, vomiting, diarrhea, dysuria or vaginal discharge. The current episode started 2 days ago. The onset of the illness was gradual.  The patient states that she believes she is currently pregnant. The patient has not had a change in bowel habit. Symptoms associated with the illness do not include chills, heartburn, constipation, urgency, hematuria, frequency or back pain.   RN Note: Pt reports she had a positive HPT last week. Started having shap pain in her vaginal and pelvic area on Monday. Stated she had some brown discharge when she wiped yesterday.          OB History    Gravida Para Term Preterm AB TAB SAB Ectopic Multiple Living   Past Medical History  Diagnosis Date  . Abnormal Pap smear   . UTI (urinary tract infection) 2012  . No pertinent past medical history   . NVD (normal vaginal delivery) 08/25/2010  . Medical history non-contributory     Past Surgical History  Procedure Laterality Date  . Cesarean section    . Cesarean section      Family History  Problem Relation Age of Onset  . Cancer Mother     Social History  Substance Use Topics  . Smoking status: Never Smoker   .  Smokeless tobacco: Never Used  . Alcohol Use: No    Allergies: No Known Allergies  Prescriptions prior to admission  Medication Sig Dispense Refill Last Dose  . ferrous sulfate 325 (65 FE) MG tablet Take 1 tablet (325 mg total) by mouth 3 (three) times daily with meals. 30 tablet 0   . ibuprofen (ADVIL,MOTRIN) 200 MG tablet Take 800 mg by mouth every 6 (six) hours as needed. For pain    12/31/2010 at Unknown  . prenatal vitamin w/FE, FA (PRENATAL 1 + 1) 27-1 MG TABS Take 1 tablet by mouth daily.    12/31/2010 at Unknown   Medical, Surgical, Family and Social histories reviewed and are listed above.  Medications and allergies reviewed.   Review of Systems  Constitutional: Negative for fever, chills and fatigue.  Gastrointestinal: Positive for abdominal pain. Negative for heartburn, nausea, vomiting, diarrhea and constipation.  Genitourinary: Positive for vaginal bleeding (brown). Negative for dysuria, urgency, frequency, hematuria and vaginal discharge.  Musculoskeletal: Negative for back pain.  Other systems negative  Physical Exam   Blood pressure 113/70, pulse 72, temperature 98.8 F (37.1 C), resp. rate 18, height  (1.753 m), weight 133 lb 9.6 oz (60.601 kg), last menstrual period 07/24/2014, currently breastfeeding.  Physical Exam  Constitutional: She is oriented to person, place,  and time. She appears well-developed and well-nourished. No distress.  HENT:  Head: Normocephalic.  Cardiovascular: Normal rate and regular rhythm.   Respiratory: Effort normal. No respiratory distress.  GI: Soft. She exhibits no distension. There is no tenderness. There is no rebound and no guarding.  Genitourinary: Vaginal discharge found.  Cervix long and closed Uterus 12 wk size, nontender White discharge in vagina  Musculoskeletal: Normal range of motion.  Neurological: She is alert and oriented to person, place, and time.  Skin: Skin is warm and dry.  Psychiatric: She has a normal  mood and affect.    MAU Course  Procedures  MDM Speculum exam done to ascertain amount of bleeding Cultures done to rule out infection as a cause of bleeding UA done to rule out cystitis Bedside US done:  Single intrauterine pregnancy, active fetus, heart rate 150s, normal appearing sac with fluid, placenta grossly normal but did not do full exam regarding [facility] status.  Results for orders placed or performed during the hospital encounter of 10/23/14 (from the past 24 hour(s))  Urinalysis, Routine w reflex microscopic (not at [facility])     Status: Abnormal   Collection Time: 10/23/14 11:25 AM  Result Value Ref Range   Color, Urine YELLOW YELLOW   APPearance CLEAR CLEAR   Specific Gravity, Urine 1.020 1.005 - 1.030   pH 6.0 5.0 - 8.0   Glucose, UA NEGATIVE NEGATIVE mg/dL   Hgb urine dipstick TRACE (A) NEGATIVE   Bilirubin Urine NEGATIVE NEGATIVE   Ketones, ur NEGATIVE NEGATIVE mg/dL   Protein, ur NEGATIVE NEGATIVE mg/dL   Urobilinogen, UA 0.2 0.0 - 1.0 mg/dL   Nitrite NEGATIVE NEGATIVE   Leukocytes, UA NEGATIVE NEGATIVE  Urine microscopic-add on     Status: None   Collection Time: 10/23/14 11:25 AM  Result Value Ref Range   Squamous Epithelial / LPF RARE RARE   WBC, UA 0-2 <3 WBC/hpf   RBC / HPF 3-6 <3 RBC/hpf   Bacteria, UA RARE RARE  Wet prep, genital     Status: Abnormal   Collection Time: 10/23/14 11:55 AM  Result Value Ref Range   Yeast Wet Prep HPF POC RARE (A) NONE SEEN   Trich, Wet Prep NONE SEEN NONE SEEN   Clue Cells Wet Prep HPF POC NONE SEEN NONE SEEN   WBC, Wet Prep HPF POC MODERATE (A) NONE SEEN    Assessment and Plan  A; Single intrauterine pregnancy      Pelvic pain in pregnancy, probably round ligament      History of brown bleeding, not evident today        Live fetus   P:  Discharge home       Reviewed results and early pregnancy evaluation       Pelvic rest       Bleeding precautions       Followup as scheduled in office for new OB visit next  week  [facility] 10/23/2014, 12:01 PM

## 2014-10-24 LAB — GC/CHLAMYDIA PROBE AMP (~~LOC~~) NOT AT ARMC
Chlamydia: NEGATIVE
NEISSERIA GONORRHEA: NEGATIVE

## 2014-11-19 LAB — OB RESULTS CONSOLE ABO/RH: RH TYPE: POSITIVE

## 2014-11-19 LAB — OB RESULTS CONSOLE GC/CHLAMYDIA
Chlamydia: NEGATIVE
Gonorrhea: NEGATIVE

## 2014-11-19 LAB — OB RESULTS CONSOLE RPR: RPR: NONREACTIVE

## 2014-11-19 LAB — OB RESULTS CONSOLE ANTIBODY SCREEN: ANTIBODY SCREEN: NEGATIVE

## 2014-11-19 LAB — OB RESULTS CONSOLE HEPATITIS B SURFACE ANTIGEN: HEP B S AG: NEGATIVE

## 2014-11-19 LAB — OB RESULTS CONSOLE HIV ANTIBODY (ROUTINE TESTING): HIV: NONREACTIVE

## 2014-11-19 LAB — OB RESULTS CONSOLE RUBELLA ANTIBODY, IGM: RUBELLA: IMMUNE

## 2015-02-13 ENCOUNTER — Inpatient Hospital Stay (HOSPITAL_COMMUNITY): Payer: Medicaid Other

## 2015-02-13 ENCOUNTER — Encounter (HOSPITAL_COMMUNITY): Payer: Self-pay

## 2015-02-13 ENCOUNTER — Inpatient Hospital Stay (HOSPITAL_COMMUNITY)
Admission: AD | Admit: 2015-02-13 | Discharge: 2015-02-13 | Disposition: A | Discharge status: Home / Self Care | Payer: Medicaid Other | Source: Ambulatory Visit | Attending: Obstetrics | Admitting: Obstetrics

## 2015-02-13 DIAGNOSIS — Z3A28 28 weeks gestation of pregnancy: Secondary | ICD-10-CM

## 2015-02-13 DIAGNOSIS — R109 Unspecified abdominal pain: Secondary | ICD-10-CM | POA: Diagnosis present

## 2015-02-13 DIAGNOSIS — O4703 False labor before 37 completed weeks of gestation, third trimester: Secondary | ICD-10-CM | POA: Diagnosis not present

## 2015-02-13 DIAGNOSIS — O471 False labor at or after 37 completed weeks of gestation: Secondary | ICD-10-CM | POA: Diagnosis not present

## 2015-02-13 DIAGNOSIS — O34219 Maternal care for unspecified type scar from previous cesarean delivery: Secondary | ICD-10-CM

## 2015-02-13 DIAGNOSIS — O368390 Maternal care for abnormalities of the fetal heart rate or rhythm, unspecified trimester, not applicable or unspecified: Secondary | ICD-10-CM

## 2015-02-13 DIAGNOSIS — O26899 Other specified pregnancy related conditions, unspecified trimester: Secondary | ICD-10-CM

## 2015-02-13 DIAGNOSIS — Z3A29 29 weeks gestation of pregnancy: Secondary | ICD-10-CM | POA: Insufficient documentation

## 2015-02-13 LAB — URINE MICROSCOPIC-ADD ON
Bacteria, UA: NONE SEEN
RBC / HPF: NONE SEEN RBC/hpf (ref 0–5)

## 2015-02-13 LAB — FETAL FIBRONECTIN: Fetal Fibronectin: NEGATIVE

## 2015-02-13 LAB — URINALYSIS, ROUTINE W REFLEX MICROSCOPIC
Bilirubin Urine: NEGATIVE
GLUCOSE, UA: NEGATIVE mg/dL
HGB URINE DIPSTICK: NEGATIVE
KETONES UR: NEGATIVE mg/dL
Nitrite: NEGATIVE
PH: 7 (ref 5.0–8.0)
PROTEIN: NEGATIVE mg/dL
Specific Gravity, Urine: 1.02 (ref 1.005–1.030)

## 2015-02-13 NOTE — MAU Note (Signed)
Pt reports she is having abd cramping not sure if they are contractions. 3-4 times today Denies bvag bleeding or discharge. Good fetal movement reported.

## 2015-02-13 NOTE — Discharge Instructions (Signed)

## 2015-02-13 NOTE — MAU Provider Note (Signed)
Chief Complaint:  Abdominal Cramping   First Provider Initiated Contact with Patient 02/13/15 1648     HPI   Cynthia Berg is a 30 y.o. [id number] at [id number] presents to maternity admissions reporting Cramping a few times per hour. Denies leaking or bleeding. She reports good fetal movement, denies LOF, vaginal bleeding, vaginal itching/burning, urinary symptoms, h/a, dizziness, n/v, diarrhea, constipation or fever/chills.  She denies headache, visual changes   RN Note: Pt reports she is having abd cramping not sure if they are contractions. 3-4 times today Denies bvag bleeding or discharge. Good fetal movement reported.        Past Medical History: Past Medical History  Diagnosis Date  . Abnormal Pap smear   . UTI (urinary tract infection) 2012  . No pertinent past medical history   . NVD (normal vaginal delivery) 08/25/2010  . Medical history non-contributory     Past obstetric history: OB History  Gravida Para Term Preterm AB SAB TAB Ectopic Multiple Living  4 3 1 2      3     # Outcome Date GA Lbr Len/2nd Weight Sex Delivery Anes PTL Lv  4 Current           3 Term 08/25/10 [id number] 11:55 / 01:29 2.761 kg (6 lb 1.4 oz) M Vag-Spont EPI  Y  2 Preterm 2010   2.892 kg (6 lb 6 oz) F VBAC EPI N Y  1 Preterm 2008   3.345 kg (7 lb 6 oz) M CS-LTranv Spinal N Y     Comments: breech      Past Surgical History: Past Surgical History  Procedure Laterality Date  . Cesarean section      C/S x 1    Family History: Family History  Problem Relation Age of Onset  . Cancer Mother     Social History: Social History  Substance Use Topics  . Smoking status: Never Smoker   . Smokeless tobacco: Never Used  . Alcohol Use: No    Allergies: No Known Allergies  Meds:  Prescriptions prior to admission  Medication Sig Dispense Refill Last Dose  . prenatal vitamin w/FE, FA (PRENATAL 1 + 1) 27-1 MG TABS Take 1 tablet by mouth daily.    02/13/2015 at Unknown time    I have reviewed  patient's Past Medical Hx, Surgical Hx, Family Hx, Social Hx, medications and allergies.   ROS:  Review of Systems  Gastrointestinal: Positive for abdominal pain. Negative for nausea, vomiting, diarrhea and constipation.  Genitourinary: Positive for pelvic pain. Negative for vaginal bleeding, vaginal discharge and vaginal pain.  Musculoskeletal: Negative for myalgias and back pain.  Other systems negative  Physical Exam  Patient Vitals for the past 24 hrs:  BP Temp Temp src Pulse Resp Height Weight  02/13/15 1622 117/81 mmHg 98.6 F (37 C) Oral 113 18 5\' 9"  (1.753 m) 70.852 kg (156 lb 3.2 oz)   Constitutional: Well-developed, well-nourished female in no acute distress.  Cardiovascular: normal rate and rhythm Respiratory: normal effort, clear to auscultate bilaterally GI: Abd soft, non-tender, gravid appropriate for gestational age.   No rebound or guarding. MS: Extremities nontender, no edema, normal ROM Neurologic: Alert and oriented x 4.  GU: Neg CVAT.  PELVIC EXAM: Cervix pink, visually closed, without lesion, scant white creamy discharge, vaginal walls and external genitalia normal  Dilation: Fingertip Effacement (%): 20 Presentation: Vertex Exam by:: Wynelle BourgeoisMarie Reily Treloar CNM   FHT:  Baseline 140 , moderate variability, accelerations present, no decelerations Contractions:  Irregular    Labs: Results for orders placed or performed during the hospital encounter of 02/13/15 (from the past 24 hour(s))  Urinalysis, Routine w reflex microscopic (not at [facility])     Status: Abnormal   Collection Time: 02/13/15  4:30 PM  Result Value Ref Range   Color, Urine YELLOW YELLOW   APPearance CLEAR CLEAR   Specific Gravity, Urine 1.020 1.005 - 1.030   pH 7.0 5.0 - 8.0   Glucose, UA NEGATIVE NEGATIVE mg/dL   Hgb urine dipstick NEGATIVE NEGATIVE   Bilirubin Urine NEGATIVE NEGATIVE   Ketones, ur NEGATIVE NEGATIVE mg/dL   Protein, ur NEGATIVE NEGATIVE mg/dL   Nitrite NEGATIVE NEGATIVE    Leukocytes, UA SMALL (A) NEGATIVE  Urine microscopic-add on     Status: Abnormal   Collection Time: 02/13/15  4:30 PM  Result Value Ref Range   Squamous Epithelial / LPF 0-5 (A) NONE SEEN   WBC, UA 0-5 0 - 5 WBC/hpf   RBC / HPF NONE SEEN 0 - 5 RBC/hpf   Bacteria, UA NONE SEEN NONE SEEN  Fetal fibronectin     Status: None   Collection Time: 02/13/15  5:07 PM  Result Value Ref Range   Fetal Fibronectin NEGATIVE NEGATIVE      Imaging:  No results found.  MAU Course/MDM: I have ordered labs and reviewed results.   >>>  FFn Negative NST reviewed Consult Dr Gaynell Face with presentation, exam findings and test results.  Treatments in MAU included   BPP done due to variable deceleration  >>>  8/8 with cervical length 3.2cm   Pt stable at time of discharge.   Assessment: 1. [redacted] weeks gestation of pregnancy   2. Fetal heart rate decelerations affecting management of mother   3.  Preterm Braxton Hicks contractions with no cervical change  Plan: Discharge home Preterm Labor precautions and fetal kick counts Follow up in Office for prenatal visits and recheck    Medication List    ASK your doctor about these medications        prenatal vitamin w/FE, FA 27-1 MG Tabs tablet  Take 1 tablet by mouth daily.        Wynelle Bourgeois CNM, MSN Certified Nurse-Midwife 02/13/2015 6:19 PM

## 2015-04-07 ENCOUNTER — Encounter (HOSPITAL_COMMUNITY): Payer: Self-pay | Admitting: *Deleted

## 2015-04-07 ENCOUNTER — Inpatient Hospital Stay (HOSPITAL_COMMUNITY)
Admission: AD | Admit: 2015-04-07 | Discharge: 2015-04-07 | Disposition: A | Discharge status: Home / Self Care | Payer: Medicaid Other | Source: Ambulatory Visit | Attending: Obstetrics | Admitting: Obstetrics

## 2015-04-07 DIAGNOSIS — O479 False labor, unspecified: Secondary | ICD-10-CM | POA: Diagnosis not present

## 2015-04-07 DIAGNOSIS — Z3A36 36 weeks gestation of pregnancy: Secondary | ICD-10-CM | POA: Insufficient documentation

## 2015-04-07 NOTE — Discharge Instructions (Signed)
Braxton Hicks Contractions °Contractions of the uterus can occur throughout pregnancy. Contractions are not always a sign that you are in labor.  °WHAT ARE BRAXTON HICKS CONTRACTIONS?  °Contractions that occur before labor are called Braxton Hicks contractions, or false labor. Toward the end of pregnancy (32-34 weeks), these contractions can develop more often and may become more forceful. This is not true labor because these contractions do not result in opening (dilatation) and thinning of the cervix. They are sometimes difficult to tell apart from true labor because these contractions can be forceful and people have different pain tolerances. You should not feel embarrassed if you go to the hospital with false labor. Sometimes, the only way to tell if you are in true labor is for your health care provider to look for changes in the cervix. °If there are no prenatal problems or other health problems associated with the pregnancy, it is completely safe to be sent home with false labor and await the onset of true labor. °HOW CAN YOU TELL THE DIFFERENCE BETWEEN TRUE AND FALSE LABOR? °False Labor °· The contractions of false labor are usually shorter and not as hard as those of true labor.   °· The contractions are usually irregular.   °· The contractions are often felt in the front of the lower abdomen and in the groin.   °· The contractions may go away when you walk around or change positions while lying down.   °· The contractions get weaker and are shorter lasting as time goes on.   °· The contractions do not usually become progressively stronger, regular, and closer together as with true labor.   °True Labor °· Contractions in true labor last 30-70 seconds, become very regular, usually become more intense, and increase in frequency.   °· The contractions do not go away with walking.   °· The discomfort is usually felt in the top of the uterus and spreads to the lower abdomen and low back.   °· True labor can be  determined by your health care provider with an exam. This will show that the cervix is dilating and getting thinner.   °WHAT TO REMEMBER °· Keep up with your usual exercises and follow other instructions given by your health care provider.   °· Take medicines as directed by your health care provider.   °· Keep your regular prenatal appointments.   °· Eat and drink lightly if you think you are going into labor.   °· If Braxton Hicks contractions are making you uncomfortable:   °¨ Change your position from lying down or resting to walking, or from walking to resting.   °¨ Sit and rest in a tub of warm water.   °¨ Drink 2-3 glasses of water. Dehydration may cause these contractions.   °¨ Do slow and deep breathing several times an hour.   °WHEN SHOULD I SEEK IMMEDIATE MEDICAL CARE? °Seek immediate medical care if: °· Your contractions become stronger, more regular, and closer together.   °· You have fluid leaking or gushing from your vagina.   °· You have a fever.   °· You pass blood-tinged mucus.   °· You have vaginal bleeding.   °· You have continuous abdominal pain.   °· You have low back pain that you never had before.   °· You feel your baby's head pushing down and causing pelvic pressure.   °· Your baby is not moving as much as it used to.   °  °This information is not intended to replace advice given to you by your health care provider. Make sure you discuss any questions you have with your health care   provider. °  °Document Released: 01/25/2005 Document Revised: 01/30/2013 Document Reviewed: 11/06/2012 °Elsevier Interactive Patient Education ©2016 [employer] ° °

## 2015-04-07 NOTE — MAU Note (Signed)
Pt reports frequent contractions and pressure in her vagina today. Denies bleeding or ROM.

## 2015-04-07 NOTE — MAU Provider Note (Signed)
History   [id number] @ 36.5 wks in with c/o brackston hicks and pelvic pressure.  states pressure is what brought her in.   CSN: [id number]  Arrival date & time 04/07/15  2006   None     No chief complaint on file.   HPI  Past Medical History  Diagnosis Date  . Abnormal Pap smear   . UTI (urinary tract infection) 2012  . No pertinent past medical history   . NVD (normal vaginal delivery) 08/25/2010  . Medical history non-contributory     Past Surgical History  Procedure Laterality Date  . Cesarean section      C/S x 1    Family History  Problem Relation Age of Onset  . Cancer Mother     Social History  Substance Use Topics  . Smoking status: Never Smoker   . Smokeless tobacco: Never Used  . Alcohol Use: No    OB History    Gravida Para Term Preterm AB TAB SAB Ectopic Multiple Living   Review of Systems  Constitutional: Negative.   HENT: Negative.   Cardiovascular: Negative.   Gastrointestinal:       Pelvic pressure  Endocrine: Negative.   Genitourinary: Negative.   Musculoskeletal: Negative.   Skin: Negative.   Allergic/Immunologic: Negative.   Neurological: Negative.   Hematological: Negative.   Psychiatric/Behavioral: Negative.     Allergies  Review of patient's allergies indicates no known allergies.  Home Medications  No current outpatient prescriptions on file.  BP 114/61 mmHg  Pulse 75  Temp(Src) 99 F (37.2 C) (Oral)  Resp 16  Ht  (1.753 m)  Wt 166 lb 4 oz (75.411 kg)  BMI 24.54 kg/m2  SpO2 100%  LMP 07/24/2014 (Approximate)  Physical Exam  Constitutional: She is oriented to person, place, and time. She appears well-developed and well-nourished.  HENT:  Head: Normocephalic.  Eyes: Pupils are equal, round, and reactive to light.  Neck: Normal range of motion.  Cardiovascular: Normal rate, regular rhythm, normal heart sounds and intact distal pulses.   Pulmonary/Chest: Effort normal and breath sounds  normal.  Abdominal: Soft. Bowel sounds are normal.  Genitourinary: Vagina normal and uterus normal.  Musculoskeletal: Normal range of motion.  Neurological: She is alert and oriented to person, place, and time. She has normal reflexes.  Skin: Skin is warm and dry.  Psychiatric: She has a normal mood and affect. Her behavior is normal. Judgment and thought content normal.    MAU Course  Procedures (including critical care time)  Labs Reviewed - No data to display No results found.   No diagnosis found.    MDM  SVE ft/th/post/high. Will d/c home with reassurring fhr.

## 2015-04-09 LAB — OB RESULTS CONSOLE GBS: GBS: NEGATIVE

## 2015-04-15 ENCOUNTER — Inpatient Hospital Stay (HOSPITAL_COMMUNITY)
Admission: AD | Admit: 2015-04-15 | Discharge: 2015-04-15 | Disposition: A | Discharge status: Home / Self Care | Payer: Medicaid Other | Source: Ambulatory Visit | Attending: Obstetrics | Admitting: Obstetrics

## 2015-04-15 ENCOUNTER — Encounter (HOSPITAL_COMMUNITY): Payer: Self-pay

## 2015-04-15 DIAGNOSIS — Z3493 Encounter for supervision of normal pregnancy, unspecified, third trimester: Secondary | ICD-10-CM | POA: Insufficient documentation

## 2015-04-15 NOTE — MAU Note (Signed)
Pt c/o contractions every 5 minutes starting around 615pm. Pt denies bleeding and leaking of fluid. Pt states baby is moving normally.

## 2015-04-17 ENCOUNTER — Inpatient Hospital Stay (HOSPITAL_COMMUNITY)
Admission: AD | Admit: 2015-04-17 | Discharge: 2015-04-17 | Disposition: A | Discharge status: Home / Self Care | Payer: Medicaid Other | Source: Ambulatory Visit | Attending: Obstetrics | Admitting: Obstetrics

## 2015-04-17 ENCOUNTER — Encounter (HOSPITAL_COMMUNITY): Payer: Self-pay | Admitting: *Deleted

## 2015-04-17 DIAGNOSIS — Z3493 Encounter for supervision of normal pregnancy, unspecified, third trimester: Secondary | ICD-10-CM | POA: Insufficient documentation

## 2015-04-17 HISTORY — DX: Unspecified abnormal cytological findings in specimens from vagina: R87.629

## 2015-04-17 NOTE — MAU Note (Signed)
Urine in lab 

## 2015-04-17 NOTE — MAU Note (Signed)
Pt reports having ctx q 5 min. Denies vag bleeding or SROm.

## 2015-04-17 NOTE — Discharge Instructions (Signed)
Fetal Movement Counts Patient Name: __________________________________________________ Patient Due Date: ____________________ Performing a fetal movement count is highly recommended in high-risk pregnancies, but it is good for every pregnant woman to do. Your health care provider may ask you to start counting fetal movements at 28 weeks of the pregnancy. Fetal movements often increase:  After eating a full meal.  After physical activity.  After eating or drinking something sweet or cold.  At rest. Pay attention to when you feel the baby is most active. This will help you notice a pattern of your baby's sleep and wake cycles and what factors contribute to an increase in fetal movement. It is important to perform a fetal movement count at the same time each day when your baby is normally most active.  HOW TO COUNT FETAL MOVEMENTS  Find a quiet and comfortable area to sit or lie down on your left side. Lying on your left side provides the best blood and oxygen circulation to your baby.  Write down the day and time on a sheet of paper or in a journal.  Start counting kicks, flutters, swishes, rolls, or jabs in a 2-hour period. You should feel at least 10 movements within 2 hours.  If you do not feel 10 movements in 2 hours, wait 2-3 hours and count again. Look for a change in the pattern or not enough counts in 2 hours. SEEK MEDICAL CARE IF:  You feel less than 10 counts in 2 hours, tried twice.  There is no movement in over an hour.  The pattern is changing or taking longer each day to reach 10 counts in 2 hours.  You feel the baby is not moving as he or she usually does. Date: ____________ Movements: ____________ Start time: ____________ Cynthia Berg time: ____________  Date: ____________ Movements: ____________ Start time: ____________ Cynthia Berg time: ____________ Date: ____________ Movements: ____________ Start time: ____________ Cynthia Berg time: ____________ Date: ____________ Movements:  ____________ Start time: ____________ Cynthia Berg time: ____________ Date: ____________ Movements: ____________ Start time: ____________ Cynthia Berg time: ____________ Date: ____________ Movements: ____________ Start time: ____________ Cynthia Berg time: ____________ Date: ____________ Movements: ____________ Start time: ____________ Cynthia Berg time: ____________ Date: ____________ Movements: ____________ Start time: ____________ Cynthia Berg time: ____________  Date: ____________ Movements: ____________ Start time: ____________ Cynthia Berg time: ____________ Date: ____________ Movements: ____________ Start time: ____________ Cynthia Berg time: ____________ Date: ____________ Movements: ____________ Start time: ____________ Cynthia Berg time: ____________ Date: ____________ Movements: ____________ Start time: ____________ Cynthia Berg time: ____________ Date: ____________ Movements: ____________ Start time: ____________ Cynthia Berg time: ____________ Date: ____________ Movements: ____________ Start time: ____________ Cynthia Berg time: ____________ Date: ____________ Movements: ____________ Start time: ____________ Cynthia Berg time: ____________  Date: ____________ Movements: ____________ Start time: ____________ Cynthia Berg time: ____________ Date: ____________ Movements: ____________ Start time: ____________ Cynthia Berg time: ____________ Date: ____________ Movements: ____________ Start time: ____________ Cynthia Berg time: ____________ Date: ____________ Movements: ____________ Start time: ____________ Cynthia Berg time: ____________ Date: ____________ Movements: ____________ Start time: ____________ Cynthia Berg time: ____________ Date: ____________ Movements: ____________ Start time: ____________ Cynthia Berg time: ____________ Date: ____________ Movements: ____________ Start time: ____________ Cynthia Berg time: ____________  Date: ____________ Movements: ____________ Start time: ____________ Cynthia Berg time: ____________ [name] Contractions Contractions of the uterus can occur throughout  pregnancy. Contractions are not always a sign that you are in labor.  WHAT ARE BRAXTON HICKS CONTRACTIONS?  Contractions that occur before labor are called Braxton Hicks contractions, or false labor. Toward the end of pregnancy (32-34 weeks), these contractions can develop more often and may become more forceful. This is not true labor because these contractions do  not result in opening (dilatation) and thinning of the cervix. They are sometimes difficult to tell apart from true labor because these contractions can be forceful and people have different pain tolerances. You should not feel embarrassed if you go to the hospital with false labor. Sometimes, the only way to tell if you are in true labor is for your health care provider to look for changes in the cervix. If there are no prenatal problems or other health problems associated with the pregnancy, it is completely safe to be sent home with false labor and await the onset of true labor. HOW CAN YOU TELL THE DIFFERENCE BETWEEN TRUE AND FALSE LABOR? False Labor  The contractions of false labor are usually shorter and not as hard as those of true labor.   The contractions are usually irregular.   The contractions are often felt in the front of the lower abdomen and in the groin.   The contractions may go away when you walk around or change positions while lying down.   The contractions get weaker and are shorter lasting as time goes on.   The contractions do not usually become progressively stronger, regular, and closer together as with true labor.  True Labor  Contractions in true labor last 30-70 seconds, become very regular, usually become more intense, and increase in frequency.   The contractions do not go away with walking.   The discomfort is usually felt in the top of the uterus and spreads to the lower abdomen and low back.   True labor can be determined by your health care provider with an exam. This will show that the  cervix is dilating and getting thinner.  WHAT TO REMEMBER  Keep up with your usual exercises and follow other instructions given by your health care provider.   Take medicines as directed by your health care provider.   Keep your regular prenatal appointments.   Eat and drink lightly if you think you are going into labor.   If Braxton Hicks contractions are making you uncomfortable:   Change your position from lying down or resting to walking, or from walking to resting.   Sit and rest in a tub of warm water.   Drink 2-3 glasses of water. Dehydration may cause these contractions.   Do slow and deep breathing several times an hour.  WHEN SHOULD I SEEK IMMEDIATE MEDICAL CARE? Seek immediate medical care if:  Your contractions become stronger, more regular, and closer together.   You have fluid leaking or gushing from your vagina.   You have a fever.   You pass blood-tinged mucus.   You have vaginal bleeding.   You have continuous abdominal pain.   You have low back pain that you never had before.   You feel your baby's head pushing down and causing pelvic pressure.   Your baby is not moving as much as it used to.    This information is not intended to replace advice given to you by your health care provider. Make sure you discuss any questions you have with your health care provider.   Document Released: 01/25/2005 Document Revised: 01/30/2013 Document Reviewed: 11/06/2012 Elsevier Interactive Patient Education [employer]2016 [employer]    This information is not intended to replace advice given to you by your health care provider. Make sure you discuss any questions you have with your health care provider.   Document Released: 02/24/2006 Document Revised: 02/15/2014 Document Reviewed: 11/22/2011 Elsevier Interactive Patient Education [employer]

## 2015-04-17 NOTE — MAU Note (Signed)
Had regular contractions last night, went to bed.  Had a large amount of bloody mucous this morning, still contracting- though not as often or as strong.  Baby not as active this morning

## 2015-04-21 ENCOUNTER — Encounter (HOSPITAL_COMMUNITY): Payer: Self-pay | Admitting: *Deleted

## 2015-04-21 ENCOUNTER — Inpatient Hospital Stay (HOSPITAL_COMMUNITY)
Admission: AD | Admit: 2015-04-21 | Discharge: 2015-04-21 | Disposition: A | Discharge status: Home / Self Care | Payer: Medicaid Other | Source: Ambulatory Visit | Attending: Obstetrics | Admitting: Obstetrics

## 2015-04-21 DIAGNOSIS — Z3493 Encounter for supervision of normal pregnancy, unspecified, third trimester: Secondary | ICD-10-CM | POA: Insufficient documentation

## 2015-04-21 MED ORDER — OXYCODONE-ACETAMINOPHEN 5-325 MG PO TABS: 2.0000 | ORAL_TABLET | Freq: Once | ORAL | Status: DC

## 2015-04-21 NOTE — Discharge Instructions (Signed)
Fetal Movement Counts Patient Name: __________________________________________________ Patient Due Date: ____________________ Performing a fetal movement count is highly recommended in high-risk pregnancies, but it is good for every pregnant woman to do. Your health care provider may ask you to start counting fetal movements at 28 weeks of the pregnancy. Fetal movements often increase:  After eating a full meal.  After physical activity.  After eating or drinking something sweet or cold.  At rest. Pay attention to when you feel the baby is most active. This will help you notice a pattern of your baby's sleep and wake cycles and what factors contribute to an increase in fetal movement. It is important to perform a fetal movement count at the same time each day when your baby is normally most active.  HOW TO COUNT FETAL MOVEMENTS 1. Find a quiet and comfortable area to sit or lie down on your left side. Lying on your left side provides the best blood and oxygen circulation to your baby. 2. Write down the day and time on a sheet of paper or in a journal. 3. Start counting kicks, flutters, swishes, rolls, or jabs in a 2-hour period. You should feel at least 10 movements within 2 hours. 4. If you do not feel 10 movements in 2 hours, wait 2-3 hours and count again. Look for a change in the pattern or not enough counts in 2 hours. SEEK MEDICAL CARE IF:  You feel less than 10 counts in 2 hours, tried twice.  There is no movement in over an hour.  The pattern is changing or taking longer each day to reach 10 counts in 2 hours.  You feel the baby is not moving as he or she usually does. Date: ____________ Movements: ____________ Start time: ____________ Finish time: ____________  Date: ____________ Movements: ____________ Start time: ____________ Finish time: ____________ Date: ____________ Movements: ____________ Start time: ____________ Finish time: ____________ Date: ____________ Movements:  ____________ Start time: ____________ Finish time: ____________ Date: ____________ Movements: ____________ Start time: ____________ Finish time: ____________ Date: ____________ Movements: ____________ Start time: ____________ Finish time: ____________ Date: ____________ Movements: ____________ Start time: ____________ Finish time: ____________ Date: ____________ Movements: ____________ Start time: ____________ Finish time: ____________  Date: ____________ Movements: ____________ Start time: ____________ Finish time: ____________ Date: ____________ Movements: ____________ Start time: ____________ Finish time: ____________ Date: ____________ Movements: ____________ Start time: ____________ Finish time: ____________ Date: ____________ Movements: ____________ Start time: ____________ Finish time: ____________ Date: ____________ Movements: ____________ Start time: ____________ Finish time: ____________ Date: ____________ Movements: ____________ Start time: ____________ Finish time: ____________ Date: ____________ Movements: ____________ Start time: ____________ Finish time: ____________  Date: ____________ Movements: ____________ Start time: ____________ Finish time: ____________ Date: ____________ Movements: ____________ Start time: ____________ Finish time: ____________ Date: ____________ Movements: ____________ Start time: ____________ Finish time: ____________ Date: ____________ Movements: ____________ Start time: ____________ Finish time: ____________ Date: ____________ Movements: ____________ Start time: ____________ Finish time: ____________ Date: ____________ Movements: ____________ Start time: ____________ Finish time: ____________ Date: ____________ Movements: ____________ Start time: ____________ Finish time: ____________  Date: ____________ Movements: ____________ Start time: ____________ Finish time: ____________ Date: ____________ Movements: ____________ Start time: ____________ Finish  time: ____________ Date: ____________ Movements: ____________ Start time: ____________ Finish time: ____________ Date: ____________ Movements: ____________ Start time: ____________ Finish time: ____________ Date: ____________ Movements: ____________ Start time: ____________ Finish time: ____________ Date: ____________ Movements: ____________ Start time: ____________ Finish time: ____________ Date: ____________ Movements: ____________ Start time: ____________ Finish time: ____________  Date: ____________ Movements: ____________ Start time: ____________ Finish   time: ____________ Date: ____________ Movements: ____________ Start time: ____________ Finish time: ____________ Date: ____________ Movements: ____________ Start time: ____________ Finish time: ____________ Date: ____________ Movements: ____________ Start time: ____________ Finish time: ____________ Date: ____________ Movements: ____________ Start time: ____________ Finish time: ____________ Date: ____________ Movements: ____________ Start time: ____________ Finish time: ____________ Date: ____________ Movements: ____________ Start time: ____________ Finish time: ____________  Date: ____________ Movements: ____________ Start time: ____________ Finish time: ____________ Date: ____________ Movements: ____________ Start time: ____________ Finish time: ____________ Date: ____________ Movements: ____________ Start time: ____________ Finish time: ____________ Date: ____________ Movements: ____________ Start time: ____________ Finish time: ____________ Date: ____________ Movements: ____________ Start time: ____________ Finish time: ____________ Date: ____________ Movements: ____________ Start time: ____________ Finish time: ____________ Date: ____________ Movements: ____________ Start time: ____________ Finish time: ____________  Date: ____________ Movements: ____________ Start time: ____________ Finish time: ____________ Date: ____________  Movements: ____________ Start time: ____________ Finish time: ____________ Date: ____________ Movements: ____________ Start time: ____________ Finish time: ____________ Date: ____________ Movements: ____________ Start time: ____________ Finish time: ____________ Date: ____________ Movements: ____________ Start time: ____________ Finish time: ____________ Date: ____________ Movements: ____________ Start time: ____________ Finish time: ____________ Date: ____________ Movements: ____________ Start time: ____________ Finish time: ____________  Date: ____________ Movements: ____________ Start time: ____________ Finish time: ____________ Date: ____________ Movements: ____________ Start time: ____________ Finish time: ____________ Date: ____________ Movements: ____________ Start time: ____________ Finish time: ____________ Date: ____________ Movements: ____________ Start time: ____________ Finish time: ____________ Date: ____________ Movements: ____________ Start time: ____________ Finish time: ____________ Date: ____________ Movements: ____________ Start time: ____________ Finish time: ____________   This information is not intended to replace advice given to you by your health care provider. Make sure you discuss any questions you have with your health care provider.   Document Released: 02/24/2006 Document Revised: 02/15/2014 Document Reviewed: 11/22/2011 Elsevier Interactive Patient Education 2016 [employer] Braxton Hicks Contractions Contractions of the uterus can occur throughout pregnancy. Contractions are not always a sign that you are in labor.  WHAT ARE BRAXTON HICKS CONTRACTIONS?  Contractions that occur before labor are called Braxton Hicks contractions, or false labor. Toward the end of pregnancy (32-34 weeks), these contractions can develop more often and may become more forceful. This is not true labor because these contractions do not result in opening (dilatation) and thinning of  the cervix. They are sometimes difficult to tell apart from true labor because these contractions can be forceful and people have different pain tolerances. You should not feel embarrassed if you go to the hospital with false labor. Sometimes, the only way to tell if you are in true labor is for your health care provider to look for changes in the cervix. If there are no prenatal problems or other health problems associated with the pregnancy, it is completely safe to be sent home with false labor and await the onset of true labor. HOW CAN YOU TELL THE DIFFERENCE BETWEEN TRUE AND FALSE LABOR? False Labor  The contractions of false labor are usually shorter and not as hard as those of true labor.   The contractions are usually irregular.   The contractions are often felt in the front of the lower abdomen and in the groin.   The contractions may go away when you walk around or change positions while lying down.   The contractions get weaker and are shorter lasting as time goes on.   The contractions do not usually become progressively stronger, regular, and closer together as with true labor.  True Labor 5. Contractions in true   labor last 30-70 seconds, become very regular, usually become more intense, and increase in frequency.  6. The contractions do not go away with walking.  7. The discomfort is usually felt in the top of the uterus and spreads to the lower abdomen and low back.  8. True labor can be determined by your health care provider with an exam. This will show that the cervix is dilating and getting thinner.  WHAT TO REMEMBER  Keep up with your usual exercises and follow other instructions given by your health care provider.   Take medicines as directed by your health care provider.   Keep your regular prenatal appointments.   Eat and drink lightly if you think you are going into labor.   If Braxton Hicks contractions are making you uncomfortable:   Change  your position from lying down or resting to walking, or from walking to resting.   Sit and rest in a tub of warm water.   Drink 2-3 glasses of water. Dehydration may cause these contractions.   Do slow and deep breathing several times an hour.  WHEN SHOULD I SEEK IMMEDIATE MEDICAL CARE? Seek immediate medical care if:  Your contractions become stronger, more regular, and closer together.   You have fluid leaking or gushing from your vagina.   You have a fever.   You pass blood-tinged mucus.   You have vaginal bleeding.   You have continuous abdominal pain.   You have low back pain that you never had before.   You feel your baby's head pushing down and causing pelvic pressure.   Your baby is not moving as much as it used to.    This information is not intended to replace advice given to you by your health care provider. Make sure you discuss any questions you have with your health care provider.   Document Released: 01/25/2005 Document Revised: 01/30/2013 Document Reviewed: 11/06/2012 Elsevier Interactive Patient Education 2016 [employer]  

## 2015-04-21 NOTE — MAU Note (Signed)
Pt c/o contractions since 2020 and are every 2-6 mins. Denies LOF or vag bleeding. Cervix was 3.5cm. +FM

## 2015-04-25 ENCOUNTER — Encounter (HOSPITAL_COMMUNITY): Payer: Self-pay | Admitting: *Deleted

## 2015-04-25 ENCOUNTER — Inpatient Hospital Stay (HOSPITAL_COMMUNITY)
Admission: AD | Admit: 2015-04-25 | Discharge: 2015-04-25 | Disposition: A | Discharge status: Home / Self Care | Payer: Medicaid Other | Source: Ambulatory Visit | Attending: Obstetrics | Admitting: Obstetrics

## 2015-04-25 DIAGNOSIS — K529 Noninfective gastroenteritis and colitis, unspecified: Secondary | ICD-10-CM | POA: Diagnosis not present

## 2015-04-25 DIAGNOSIS — O99613 Diseases of the digestive system complicating pregnancy, third trimester: Secondary | ICD-10-CM | POA: Insufficient documentation

## 2015-04-25 DIAGNOSIS — R110 Nausea: Secondary | ICD-10-CM | POA: Diagnosis present

## 2015-04-25 DIAGNOSIS — O471 False labor at or after 37 completed weeks of gestation: Secondary | ICD-10-CM

## 2015-04-25 DIAGNOSIS — Z3A38 38 weeks gestation of pregnancy: Secondary | ICD-10-CM | POA: Insufficient documentation

## 2015-04-25 DIAGNOSIS — O479 False labor, unspecified: Secondary | ICD-10-CM

## 2015-04-25 LAB — CBC
HEMATOCRIT: 34.3 % — AB (ref 36.0–46.0)
Hemoglobin: 11.5 g/dL — ABNORMAL LOW (ref 12.0–15.0)
MCH: 29.9 pg (ref 26.0–34.0)
MCHC: 33.5 g/dL (ref 30.0–36.0)
MCV: 89.3 fL (ref 78.0–100.0)
PLATELETS: 215 10*3/uL (ref 150–400)
RBC: 3.84 MIL/uL — AB (ref 3.87–5.11)
RDW: 14.4 % (ref 11.5–15.5)
WBC: 8.1 10*3/uL (ref 4.0–10.5)

## 2015-04-25 LAB — URINE MICROSCOPIC-ADD ON

## 2015-04-25 LAB — COMPREHENSIVE METABOLIC PANEL
ALBUMIN: 3.2 g/dL — AB (ref 3.5–5.0)
ALT: 10 U/L — AB (ref 14–54)
AST: 19 U/L (ref 15–41)
Alkaline Phosphatase: 109 U/L (ref 38–126)
Anion gap: 8 (ref 5–15)
BUN: 7 mg/dL (ref 6–20)
CHLORIDE: 105 mmol/L (ref 101–111)
CO2: 22 mmol/L (ref 22–32)
CREATININE: 0.64 mg/dL (ref 0.44–1.00)
Calcium: 8.9 mg/dL (ref 8.9–10.3)
GFR calc Af Amer: >60 mL/min (ref >60)
GLUCOSE: 95 mg/dL (ref 65–99)
Potassium: 3.8 mmol/L (ref 3.5–5.1)
Sodium: 135 mmol/L (ref 135–145)
Total Bilirubin: 0.2 mg/dL — ABNORMAL LOW (ref 0.3–1.2)
Total Protein: 6.6 g/dL (ref 6.5–8.1)

## 2015-04-25 LAB — URINALYSIS, ROUTINE W REFLEX MICROSCOPIC
BILIRUBIN URINE: NEGATIVE
GLUCOSE, UA: NEGATIVE mg/dL
Ketones, ur: NEGATIVE mg/dL
NITRITE: NEGATIVE
PH: 5.5 (ref 5.0–8.0)
Protein, ur: NEGATIVE mg/dL
SPECIFIC GRAVITY, URINE: 1.02 (ref 1.005–1.030)

## 2015-04-25 MED ORDER — LACTATED RINGERS IV BOLUS (SEPSIS)
1000.0000 mL | Freq: Once | INTRAVENOUS | Status: AC
  Administered 2015-04-25: 1000 mL via INTRAVENOUS

## 2015-04-25 MED ORDER — ONDANSETRON 8 MG PO TBDP: 8.0000 mg | ORAL_TABLET | Freq: Three times a day (TID) | ORAL | Status: DC | PRN

## 2015-04-25 MED ORDER — SODIUM CHLORIDE 0.9 % IV SOLN
8.0000 mg | Freq: Once | INTRAVENOUS | Status: AC
  Administered 2015-04-25: 8 mg via INTRAVENOUS
  Filled 2015-04-25: qty 4

## 2015-04-25 NOTE — MAU Provider Note (Signed)
History     CSN: [id number]  Arrival date and time: 04/25/15 1253   First Provider Initiated Contact with Patient 04/25/15 1352         Chief Complaint  Patient presents with  . Contractions  . Nausea  . Diarrhea   HPI Cynthia Berg is a 30 y.o. [id number] at [redacted]w[redacted]d who presents for nausea, diarrhea, & contractions.  Symptoms began last night. Has been very nauseated, no vomiting. Reports contractions every 5 minutes, worse earlier this morning, not as severe now. Reports 3 episode of watery stool since midnight. Denies rectal pain & rectal bleeding. Denies vaginal bleeding or LOF. Positive fetal movement.  Denies fever/chills, no sick contacts.   OB History    Gravida Para Term Preterm AB TAB SAB Ectopic Multiple Living   Past Medical History  Diagnosis Date  . Abnormal Pap smear   . UTI (urinary tract infection) 2012  . No pertinent past medical history   . NVD (normal vaginal delivery) 08/25/2010  . Medical history non-contributory   . Vaginal Pap smear, abnormal     Past Surgical History  Procedure Laterality Date  . Cesarean section      C/S x 1    Family History  Problem Relation Age of Onset  . Cancer Mother     breast    Social History  Substance Use Topics  . Smoking status: Never Smoker   . Smokeless tobacco: Never Used  . Alcohol Use: No    Allergies: No Known Allergies  Prescriptions prior to admission  Medication Sig Dispense Refill Last Dose  . IRON PO Take 1 tablet by mouth daily.   04/21/2015 at Unknown time  . Prenatal Vit-Fe Fumarate-FA (PRENATAL MULTIVITAMIN) TABS tablet Take 1 tablet by mouth daily at 12 noon.   04/21/2015 at Unknown time    Review of Systems  Constitutional: Negative.   Gastrointestinal: Positive for nausea, abdominal pain and diarrhea. Negative for vomiting, constipation, blood in stool and melena.  Genitourinary: Negative.    Physical Exam   Blood pressure 112/61, pulse 81, temperature  97.7 F (36.5 C), temperature source Oral, resp. rate 18, height  (1.753 m), weight 169 lb (76.658 kg), last menstrual period 07/24/2014, currently breastfeeding.  Physical Exam  Nursing note and vitals reviewed. Constitutional: She is oriented to person, place, and time. She appears well-developed and well-nourished. No distress.  HENT:  Head: Normocephalic and atraumatic.  Eyes: Conjunctivae are normal. Right eye exhibits no discharge. Left eye exhibits no discharge. No scleral icterus.  Neck: Normal range of motion.  Cardiovascular: Normal rate.   Respiratory: Effort normal. No respiratory distress.  GI: Soft. There is no tenderness.  Neurological: She is alert and oriented to person, place, and time.  Skin: Skin is warm and dry. She is not diaphoretic.  Psychiatric: She has a normal mood and affect. Her behavior is normal. Judgment and thought content normal.   Dilation: 3 Effacement (%): 30 Cervical Position: Posterior Station: -3 Presentation: Vertex Exam by:: Dorrene German RN  Fetal Tracing:  Baseline: 140 Variability: moderate Accelerations: 15x15 Decelerations: none  Toco: UI & irregular ctx  MAU Course  Procedures Results for orders placed or performed during the hospital encounter of 04/25/15 (from the past 24 hour(s))  Urinalysis, Routine w reflex microscopic (not at [facility])     Status: Abnormal   Collection Time: 04/25/15  1:00 PM  Result Value  Ref Range   Color, Urine YELLOW YELLOW   APPearance HAZY (A) CLEAR   Specific Gravity, Urine 1.020 1.005 - 1.030   pH 5.5 5.0 - 8.0   Glucose, UA NEGATIVE NEGATIVE mg/dL   Hgb urine dipstick TRACE (A) NEGATIVE   Bilirubin Urine NEGATIVE NEGATIVE   Ketones, ur NEGATIVE NEGATIVE mg/dL   Protein, ur NEGATIVE NEGATIVE mg/dL   Nitrite NEGATIVE NEGATIVE   Leukocytes, UA MODERATE (A) NEGATIVE  Urine microscopic-add on     Status: Abnormal   Collection Time: 04/25/15  1:00 PM  Result Value Ref Range   Squamous Epithelial  / LPF 0-5 (A) NONE SEEN   WBC, UA 0-5 0 - 5 WBC/hpf   RBC / HPF 0-5 0 - 5 RBC/hpf   Bacteria, UA FEW (A) NONE SEEN  CBC     Status: Abnormal   Collection Time: 04/25/15  1:52 PM  Result Value Ref Range   WBC 8.1 4.0 - 10.5 K/uL   RBC 3.84 (L) 3.87 - 5.11 MIL/uL   Hemoglobin 11.5 (L) 12.0 - 15.0 g/dL   HCT 95.234.3 (L) 84.136.0 - 32.446.0 %   MCV 89.3 78.0 - 100.0 fL   MCH 29.9 26.0 - 34.0 pg   MCHC 33.5 30.0 - 36.0 g/dL   RDW 40.114.4 02.711.5 - 25.315.5 %   Platelets 215 150 - 400 K/uL  Comprehensive metabolic panel     Status: Abnormal   Collection Time: 04/25/15  1:52 PM  Result Value Ref Range   Sodium 135 135 - 145 mmol/L   Potassium 3.8 3.5 - 5.1 mmol/L   Chloride 105 101 - 111 mmol/L   CO2 22 22 - 32 mmol/L   Glucose, Bld 95 65 - 99 mg/dL   BUN 7 6 - 20 mg/dL   Creatinine, Ser 6.640.64 0.44 - 1.00 mg/dL   Calcium 8.9 8.9 - 40.310.3 mg/dL   Total Protein 6.6 6.5 - 8.1 g/dL   Albumin 3.2 (L) 3.5 - 5.0 g/dL   AST 19 15 - 41 U/L   ALT 10 (L) 14 - 54 U/L   Alkaline Phosphatase 109 38 - 126 U/L   Total Bilirubin 0.2 (L) 0.3 - 1.2 mg/dL   GFR calc non Af Amer >60 >60 mL/min   GFR calc Af Amer >60 >60 mL/min   Anion gap 8 5 - 15    MDM Category 1 tracing SVE unchanged from office IV fluids & zofran 8 mg IV Pt reports improvement in symptoms  No episodes of diarrhea while in MAU SVE unchanged after 1 hour of observation Will send home with antiemetics Assessment and Plan  A: Gastroenteritis  P: Discharge home Bland diet Rx zofran Discussed reasons to return  Cynthia Berg 04/25/2015, 1:46 PM

## 2015-04-25 NOTE — MAU Note (Signed)
Patient presents with contractions, nausea and diarrhea which started since midnight, diarrhea x 3

## 2015-04-25 NOTE — Discharge Instructions (Signed)
Food Choices to Help Relieve Diarrhea, Adult When you have diarrhea, the foods you eat and your eating habits are very important. Choosing the right foods and drinks can help relieve diarrhea. Also, because diarrhea can last up to 7 days, you need to replace lost fluids and electrolytes (such as sodium, potassium, and chloride) in order to help prevent dehydration.  WHAT GENERAL GUIDELINES DO I NEED TO FOLLOW?  Slowly drink 1 cup (8 oz) of fluid for each episode of diarrhea. If you are getting enough fluid, your urine will be clear or pale yellow.  Eat starchy foods. Some good choices include white rice, white toast, pasta, low-fiber cereal, baked potatoes (without the skin), saltine crackers, and bagels.  Avoid large servings of any cooked vegetables.  Limit fruit to two servings per day. A serving is  cup or 1 small piece.  Choose foods with less than 2 g of fiber per serving.  Limit fats to less than 8 tsp (38 g) per day.  Avoid fried foods.  Eat foods that have probiotics in them. Probiotics can be found in certain dairy products.  Avoid foods and beverages that may increase the speed at which food moves through the stomach and intestines (gastrointestinal tract). Things to avoid include:  High-fiber foods, such as dried fruit, raw fruits and vegetables, nuts, seeds, and whole grain foods.  Spicy foods and high-fat foods.  Foods and beverages sweetened with high-fructose corn syrup, honey, or sugar alcohols such as xylitol, sorbitol, and mannitol. WHAT FOODS ARE RECOMMENDED? Grains White rice. White, French, or pita breads (fresh or toasted), including plain rolls, buns, or bagels. White pasta. Saltine, soda, or graham crackers. Pretzels. Low-fiber cereal. Cooked cereals made with water (such as cornmeal, farina, or cream cereals). Plain muffins. Matzo. Melba toast. Zwieback.  Vegetables Potatoes (without the skin). Strained tomato and vegetable juices. Most well-cooked and canned  vegetables without seeds. Tender lettuce. Fruits Cooked or canned applesauce, apricots, cherries, fruit cocktail, grapefruit, peaches, pears, or plums. Fresh bananas, apples without skin, cherries, grapes, cantaloupe, grapefruit, peaches, oranges, or plums.  Meat and Other Protein Products Baked or boiled chicken. Eggs. Tofu. Fish. Seafood. Smooth peanut butter. Ground or well-cooked tender beef, ham, veal, lamb, pork, or poultry.  Dairy Plain yogurt, kefir, and unsweetened liquid yogurt. Lactose-free milk, buttermilk, or soy milk. Plain hard cheese. Beverages Sport drinks. Clear broths. Diluted fruit juices (except prune). Regular, caffeine-free sodas such as ginger ale. Water. Decaffeinated teas. Oral rehydration solutions. Sugar-free beverages not sweetened with sugar alcohols. Other Bouillon, broth, or soups made from recommended foods.  The items listed above may not be a complete list of recommended foods or beverages. Contact your dietitian for more options. WHAT FOODS ARE NOT RECOMMENDED? Grains Whole grain, whole wheat, bran, or rye breads, rolls, pastas, crackers, and cereals. Wild or brown rice. Cereals that contain more than 2 g of fiber per serving. Corn tortillas or taco shells. Cooked or dry oatmeal. Granola. Popcorn. Vegetables Raw vegetables. Cabbage, broccoli, Brussels sprouts, artichokes, baked beans, beet greens, corn, kale, legumes, peas, sweet potatoes, and yams. Potato skins. Cooked spinach and cabbage. Fruits Dried fruit, including raisins and dates. Raw fruits. Stewed or dried prunes. Fresh apples with skin, apricots, mangoes, pears, raspberries, and strawberries.  Meat and Other Protein Products Chunky peanut butter. Nuts and seeds. Beans and lentils. Bacon.  Dairy High-fat cheeses. Milk, chocolate milk, and beverages made with milk, such as milk shakes. Cream. Ice cream. Sweets and Desserts Sweet rolls, doughnuts, and sweet breads.   Pancakes and waffles. Fats and  Oils Butter. Cream sauces. Margarine. Salad oils. Plain salad dressings. Olives. Avocados.  Beverages Caffeinated beverages (such as coffee, tea, soda, or energy drinks). Alcoholic beverages. Fruit juices with pulp. Prune juice. Soft drinks sweetened with high-fructose corn syrup or sugar alcohols. Other Coconut. Hot sauce. Chili powder. Mayonnaise. Gravy. Cream-based or milk-based soups.  The items listed above may not be a complete list of foods and beverages to avoid. Contact your dietitian for more information. WHAT SHOULD I DO IF I BECOME DEHYDRATED? Diarrhea can sometimes lead to dehydration. Signs of dehydration include dark urine and dry mouth and skin. If you think you are dehydrated, you should rehydrate with an oral rehydration solution. These solutions can be purchased at pharmacies, retail stores, or online.  Drink -1 cup (120-240 mL) of oral rehydration solution each time you have an episode of diarrhea. If drinking this amount makes your diarrhea worse, try drinking smaller amounts more often. For example, drink 1-3 tsp (5-15 mL) every 5-10 minutes.  A general rule for staying hydrated is to drink 1-2 L of fluid per day. Talk to your health care provider about the specific amount you should be drinking each day. Drink enough fluids to keep your urine clear or pale yellow.   This information is not intended to replace advice given to you by your health care provider. Make sure you discuss any questions you have with your health care provider.   Document Released: 04/17/2003 Document Revised: 02/15/2014 Document Reviewed: 12/18/2012 Elsevier Interactive Patient Education 2016 Else[employer] Diarrhea Diarrhea is frequent loose and watery bowel movements. It can cause you to feel weak and dehydrated. Dehydration can cause you to become tired and thirsty, have a dry mouth, and have decreased urination that often is dark yellow. Diarrhea is a sign of another problem, most often an  infection that will not last long. In most cases, diarrhea typically lasts 2-3 days. However, it can last longer if it is a sign of something more serious. It is important to treat your diarrhea as directed by your caregiver to lessen or prevent future episodes of diarrhea. CAUSES  Some common causes include:  Gastrointestinal infections caused by viruses, bacteria, or parasites.  Food poisoning or food allergies.  Certain medicines, such as antibiotics, chemotherapy, and laxatives.  Artificial sweeteners and fructose.  Digestive disorders. HOME CARE INSTRUCTIONS  Ensure adequate fluid intake (hydration): Have 1 cup (8 oz) of fluid for each diarrhea episode. Avoid fluids that contain simple sugars or sports drinks, fruit juices, whole milk products, and sodas. Your urine should be clear or pale yellow if you are drinking enough fluids. Hydrate with an oral rehydration solution that you can purchase at pharmacies, retail stores, and online. You can prepare an oral rehydration solution at home by mixing the following ingredients together:   - tsp table salt.   tsp baking soda.   tsp salt substitute containing potassium chloride.  1  tablespoons sugar.  1 L (34 oz) of water.  Certain foods and beverages may increase the speed at which food moves through the gastrointestinal (GI) tract. These foods and beverages should be avoided and include:  Caffeinated and alcoholic beverages.  High-fiber foods, such as raw fruits and vegetables, nuts, seeds, and whole grain breads and cereals.  Foods and beverages sweetened with sugar alcohols, such as xylitol, sorbitol, and mannitol.  Some foods may be well tolerated and may help thicken stool including:  Starchy foods, such as rice, toast, pasta,   low-sugar cereal, oatmeal, grits, baked potatoes, crackers, and bagels.  Bananas.  Applesauce.  Add probiotic-rich foods to help increase healthy bacteria in the GI tract, such as yogurt and  fermented milk products.  Wash your hands well after each diarrhea episode.  Only take over-the-counter or prescription medicines as directed by your caregiver.  Take a warm bath to relieve any burning or pain from frequent diarrhea episodes. SEEK IMMEDIATE MEDICAL CARE IF:   You are unable to keep fluids down.  You have persistent vomiting.  You have blood in your stool, or your stools are black and tarry.  You do not urinate in 6-8 hours, or there is only a small amount of very dark urine.  You have abdominal pain that increases or localizes.  You have weakness, dizziness, confusion, or light-headedness.  You have a severe headache.  Your diarrhea gets worse or does not get better.  You have a fever or persistent symptoms for more than 2-3 days.  You have a fever and your symptoms suddenly get worse. MAKE SURE YOU:   Understand these instructions.  Will watch your condition.  Will get help right away if you are not doing well or get worse.   This information is not intended to replace advice given to you by your health care provider. Make sure you discuss any questions you have with your health care provider.   Document Released: 01/15/2002 Document Revised: 02/15/2014 Document Reviewed: 10/03/2011 Elsevier Interactive Patient Education 2016 Elsevier Inc.  

## 2015-04-30 ENCOUNTER — Other Ambulatory Visit: Payer: Self-pay | Admitting: Obstetrics

## 2015-05-01 ENCOUNTER — Telehealth (HOSPITAL_COMMUNITY): Payer: Self-pay | Admitting: *Deleted

## 2015-05-01 ENCOUNTER — Encounter (HOSPITAL_COMMUNITY): Payer: Self-pay | Admitting: *Deleted

## 2015-05-01 NOTE — Telephone Encounter (Signed)
Preadmission screen  

## 2015-05-02 ENCOUNTER — Inpatient Hospital Stay (HOSPITAL_COMMUNITY)
Admission: AD | Admit: 2015-05-02 | Discharge: 2015-05-02 | Disposition: A | Discharge status: Home / Self Care | Payer: Medicaid Other | Source: Ambulatory Visit | Attending: Obstetrics | Admitting: Obstetrics

## 2015-05-02 ENCOUNTER — Encounter (HOSPITAL_COMMUNITY): Payer: Self-pay | Admitting: *Deleted

## 2015-05-02 LAB — URINALYSIS, ROUTINE W REFLEX MICROSCOPIC
Bilirubin Urine: NEGATIVE
GLUCOSE, UA: NEGATIVE mg/dL
Ketones, ur: 15 mg/dL — AB
Nitrite: NEGATIVE
PH: 5.5 (ref 5.0–8.0)
PROTEIN: NEGATIVE mg/dL
Specific Gravity, Urine: 1.025 (ref 1.005–1.030)

## 2015-05-02 LAB — URINE MICROSCOPIC-ADD ON

## 2015-05-02 NOTE — Discharge Instructions (Signed)

## 2015-05-02 NOTE — MAU Note (Addendum)
C/o bloody discharge today when she wipes; had SVE 2 days ago and she was 3cm; pt has been 3 cm for past 3 weeks;

## 2015-05-03 ENCOUNTER — Inpatient Hospital Stay (HOSPITAL_COMMUNITY)
Admission: EM | Admit: 2015-05-03 | Discharge: 2015-05-05 | DRG: 775 | Disposition: A | Discharge status: Home / Self Care | Payer: Medicaid Other | Source: Ambulatory Visit | Attending: Obstetrics | Admitting: Obstetrics

## 2015-05-03 ENCOUNTER — Encounter (HOSPITAL_COMMUNITY): Payer: Self-pay

## 2015-05-03 DIAGNOSIS — Z3A39 39 weeks gestation of pregnancy: Secondary | ICD-10-CM

## 2015-05-03 LAB — CBC
HCT: 36.8 % (ref 36.0–46.0)
HEMATOCRIT: 37.6 % (ref 36.0–46.0)
HEMOGLOBIN: 13.1 g/dL (ref 12.0–15.0)
Hemoglobin: 12.4 g/dL (ref 12.0–15.0)
MCH: 30.7 pg (ref 26.0–34.0)
MCH: 30 pg (ref 26.0–34.0)
MCHC: 34.8 g/dL (ref 30.0–36.0)
MCHC: 33.7 g/dL (ref 30.0–36.0)
MCV: 88.1 fL (ref 78.0–100.0)
MCV: 88.9 fL (ref 78.0–100.0)
PLATELETS: 229 10*3/uL (ref 150–400)
Platelets: 245 10*3/uL (ref 150–400)
RBC: 4.27 MIL/uL (ref 3.87–5.11)
RBC: 4.14 MIL/uL (ref 3.87–5.11)
RDW: 14.5 % (ref 11.5–15.5)
RDW: 14.4 % (ref 11.5–15.5)
WBC: 8.3 10*3/uL (ref 4.0–10.5)
WBC: 13.4 10*3/uL — AB (ref 4.0–10.5)

## 2015-05-03 LAB — TYPE AND SCREEN
ABO/RH(D): B POS
Antibody Screen: NEGATIVE

## 2015-05-03 LAB — ABO/RH: ABO/RH(D): B POS

## 2015-05-03 LAB — RPR: RPR Ser Ql: NONREACTIVE

## 2015-05-03 MED ORDER — BENZOCAINE-MENTHOL 20-0.5 % EX AERO
1.0000 "application " | INHALATION_SPRAY | CUTANEOUS | Status: DC | PRN
  Administered 2015-05-03: 1 via TOPICAL
  Filled 2015-05-03: qty 56

## 2015-05-03 MED ORDER — OXYTOCIN BOLUS FROM INFUSION
500.0000 mL | INTRAVENOUS | Status: DC
  Administered 2015-05-03: 500 mL via INTRAVENOUS

## 2015-05-03 MED ORDER — BUTORPHANOL TARTRATE 1 MG/ML IJ SOLN: 1.0000 mg | INTRAMUSCULAR | Status: DC | PRN

## 2015-05-03 MED ORDER — OXYCODONE-ACETAMINOPHEN 5-325 MG PO TABS: 1.0000 | ORAL_TABLET | ORAL | Status: DC | PRN

## 2015-05-03 MED ORDER — FLEET ENEMA 7-19 GM/118ML RE ENEM: 1.0000 | ENEMA | RECTAL | Status: DC | PRN

## 2015-05-03 MED ORDER — ACETAMINOPHEN 325 MG PO TABS: 650.0000 mg | ORAL_TABLET | ORAL | Status: DC | PRN

## 2015-05-03 MED ORDER — SENNOSIDES-DOCUSATE SODIUM 8.6-50 MG PO TABS
2.0000 | ORAL_TABLET | ORAL | Status: DC
  Administered 2015-05-03 – 2015-05-04 (×2): 2 via ORAL
  Filled 2015-05-03 (×2): qty 2

## 2015-05-03 MED ORDER — ONDANSETRON HCL 4 MG PO TABS: 4.0000 mg | ORAL_TABLET | ORAL | Status: DC | PRN

## 2015-05-03 MED ORDER — CITRIC ACID-SODIUM CITRATE 334-500 MG/5ML PO SOLN: 30.0000 mL | ORAL | Status: DC | PRN

## 2015-05-03 MED ORDER — SIMETHICONE 80 MG PO CHEW: 80.0000 mg | CHEWABLE_TABLET | ORAL | Status: DC | PRN

## 2015-05-03 MED ORDER — IBUPROFEN 600 MG PO TABS
600.0000 mg | ORAL_TABLET | Freq: Four times a day (QID) | ORAL | Status: DC
  Administered 2015-05-03 – 2015-05-05 (×10): 600 mg via ORAL
  Filled 2015-05-03 (×10): qty 1

## 2015-05-03 MED ORDER — OXYTOCIN 10 UNIT/ML IJ SOLN
2.5000 [IU]/h | INTRAVENOUS | Status: DC
  Administered 2015-05-03: 2.5 [IU]/h via INTRAVENOUS
  Filled 2015-05-03: qty 10

## 2015-05-03 MED ORDER — LANOLIN HYDROUS EX OINT: TOPICAL_OINTMENT | CUTANEOUS | Status: DC | PRN

## 2015-05-03 MED ORDER — DIPHENHYDRAMINE HCL 25 MG PO CAPS: 25.0000 mg | ORAL_CAPSULE | Freq: Four times a day (QID) | ORAL | Status: DC | PRN

## 2015-05-03 MED ORDER — OXYCODONE-ACETAMINOPHEN 5-325 MG PO TABS
2.0000 | ORAL_TABLET | ORAL | Status: DC | PRN
  Administered 2015-05-03: 2 via ORAL
  Filled 2015-05-03: qty 2

## 2015-05-03 MED ORDER — ONDANSETRON HCL 4 MG/2ML IJ SOLN: 4.0000 mg | INTRAMUSCULAR | Status: DC | PRN

## 2015-05-03 MED ORDER — DIBUCAINE 1 % RE OINT
1.0000 "application " | TOPICAL_OINTMENT | RECTAL | Status: DC | PRN
  Administered 2015-05-03: 1 via RECTAL
  Filled 2015-05-03: qty 28

## 2015-05-03 MED ORDER — ACETAMINOPHEN 325 MG PO TABS
650.0000 mg | ORAL_TABLET | ORAL | Status: DC | PRN
  Administered 2015-05-03 – 2015-05-04 (×5): 650 mg via ORAL
  Filled 2015-05-03 (×5): qty 2

## 2015-05-03 MED ORDER — LACTATED RINGERS IV SOLN
INTRAVENOUS | Status: DC
  Administered 2015-05-03: 04:00:00 via INTRAVENOUS

## 2015-05-03 MED ORDER — TETANUS-DIPHTH-ACELL PERTUSSIS 5-2.5-18.5 LF-MCG/0.5 IM SUSP: 0.5000 mL | Freq: Once | INTRAMUSCULAR | Status: DC

## 2015-05-03 MED ORDER — ZOLPIDEM TARTRATE 5 MG PO TABS: 5.0000 mg | ORAL_TABLET | Freq: Every evening | ORAL | Status: DC | PRN

## 2015-05-03 MED ORDER — FERROUS SULFATE 325 (65 FE) MG PO TABS
325.0000 mg | ORAL_TABLET | Freq: Two times a day (BID) | ORAL | Status: DC
  Administered 2015-05-03 – 2015-05-05 (×5): 325 mg via ORAL
  Filled 2015-05-03 (×5): qty 1

## 2015-05-03 MED ORDER — LIDOCAINE HCL (PF) 1 % IJ SOLN
30.0000 mL | INTRAMUSCULAR | Status: DC | PRN
  Filled 2015-05-03: qty 30

## 2015-05-03 MED ORDER — LACTATED RINGERS IV SOLN: 500.0000 mL | INTRAVENOUS | Status: DC | PRN

## 2015-05-03 MED ORDER — WITCH HAZEL-GLYCERIN EX PADS
1.0000 "application " | MEDICATED_PAD | CUTANEOUS | Status: DC | PRN
  Administered 2015-05-03: 1 via TOPICAL

## 2015-05-03 MED ORDER — PRENATAL MULTIVITAMIN CH
1.0000 | ORAL_TABLET | Freq: Every day | ORAL | Status: DC
  Administered 2015-05-03 – 2015-05-05 (×3): 1 via ORAL
  Filled 2015-05-03 (×3): qty 1

## 2015-05-03 MED ORDER — ONDANSETRON HCL 4 MG/2ML IJ SOLN: 4.0000 mg | Freq: Four times a day (QID) | INTRAMUSCULAR | Status: DC | PRN

## 2015-05-03 NOTE — Lactation Note (Signed)
This note was copied from a baby's chart. Lactation Consultation Note  Patient Name: Cynthia Berg ZOXWR'UToday's Date: 05/03/2015 Reason for consult: Initial assessment Infant is 10 hours old & [facility]. Mom had baby BF on right breast in football hold (nurse had helped her latch & did a LATCH score). Mom reported pain from her contractions but none from BF, just strong tugs. Mom reports she BF her last baby for ~3073yr (child is 4074yrs old). Mom has WIC but also has private insurance; encouraged mom to call her insurance about a DEBP or to ask WIC later if her insurance do not provide one since she'll be going back to work. Provided mom with BF booklet, feeding log, & BF resources; discussed o/p number & support groups. Reviewed BF pages in Baby & Me booklet; discussed position options, latch tips, prevention & treatment of sore nipples and engorgement, number of wet & dirty diapers, & milk storage guidelines. Mom reports she does not remember how to do hand expression so encouraged her to have her nurse show her later (I passed this onto her nurse as well). Encouraged mom to feed on cue at least 8-12x/ 24hrs. Mom reported no other questions. Guests had come in while [facility] was talking so LC left before feeding ended - encouraged mom to look at nipple shape after he comes off and the goal is to have a round nipple. Encouraged mom to [facility] if help is needed later.  Maternal Data    Feeding Feeding Type: Breast Fed  LATCH Score/Interventions Latch: Grasps breast easily, tongue down, lips flanged, rhythmical sucking.  Audible Swallowing: A few with stimulation  Type of Nipple: Everted at rest and after stimulation  Comfort (Breast/Nipple): Soft / non-tender     Hold (Positioning): Assistance needed to correctly position infant at breast and maintain latch.  LATCH Score: 8  Lactation Tools Discussed/Used WIC Program: Yes   Consult Status Consult Status:  Follow-up Date: 05/04/15 Follow-up type: In-patient    Oneal GroutLaura C Cannie Muckle 05/03/2015, 2:58 PM

## 2015-05-03 NOTE — H&P (Signed)
This is Dr. Francoise CeoBernard Marshall dictating the history and physical on  Cynthia Berg  she's a 30 year old gravida 4 para 02/09/2001 negative GBS EDC 05/05/2015 admitted in labor 9 cm dilated +2 station amniotomy performed fluids clear she had a normal vaginal delivery of a female Apgar 8 and 9 placenta was spontaneous intact there was no episiotomy or laceration Past medical history negative Past surgical history negative Social history negative System review negative Physical exam well-developed female post delivery HEENT negative Lungs clear to P&A Heart regular rhythm no murmurs no gallops Breasts negative Abdomen uterus 20 week postpartum size Pelvic as described above Extremities negative

## 2015-05-04 NOTE — Lactation Note (Signed)
This note was copied from a baby's chart. Lactation Consultation Note; Experienced BF mom latching baby with RN assist when I went into room. Mom reports nipples are sore- painful with latch. Reports she does not want to use football or cross cradle position- they are uncomfortable for her. Mom using cradle hold- baby wants to slide to tip of nipple.On and off the breast. Nipple pinched when he came off the breast. Is doing some biting on my gloved finger. Suggested using football hold to help the nipple to heal. Mom does not want to change position. Comfort gels given with instructions for use. Baby content in mom's arms after feeding. Reports her last baby was like this- nipples got cracked and bleeding. To call for assist prn  Patient Name: Cynthia Glade LloydGracious Briner WUJWJ'XToday's Date: 05/04/2015 Reason for consult: Follow-up assessment   Maternal Data Formula Feeding for Exclusion: No Has patient been taught Hand Expression?: Yes Does the patient have breastfeeding experience prior to this delivery?: Yes  Feeding Feeding Type: Breast Fed Length of feed: 20 min  LATCH Score/Interventions Latch: Repeated attempts needed to sustain latch, nipple held in mouth throughout feeding, stimulation needed to elicit sucking reflex.  Audible Swallowing: A few with stimulation  Type of Nipple: Everted at rest and after stimulation  Comfort (Breast/Nipple): Filling, red/small blisters or bruises, mild/mod discomfort  Problem noted: Mild/Moderate discomfort Interventions (Mild/moderate discomfort): Comfort gels;Hand expression  Hold (Positioning): Assistance needed to correctly position infant at breast and maintain latch. Intervention(s): Breastfeeding basics reviewed;Position options  LATCH Score: 6  Lactation Tools Discussed/Used     Consult Status Consult Status: Follow-up Date: 05/05/15 Follow-up type: In-patient    Pamelia HoitWeeks, Willia Genrich D 05/04/2015, 10:57 AM

## 2015-05-04 NOTE — Progress Notes (Signed)
Patient ID: Cynthia Berg, female   DOB: 1985-03-29, 30 y.o.   MRN: [id number] Postpartum day one Blood pressure 119/69 Pulse 64 Fundus firm Legs negative doing well

## 2015-05-05 ENCOUNTER — Inpatient Hospital Stay (HOSPITAL_COMMUNITY): Admission: RE | Admit: 2015-05-05 | Payer: Medicaid Other | Source: Ambulatory Visit | Admitting: Obstetrics

## 2015-05-05 NOTE — Discharge Instructions (Signed)
Discharge instructions   You can wash your hair  Shower  Eat what you want  Drink what you want  See me in 6 weeks  Your ankles are going to swell more in the next 2 weeks than when pregnant  No sex for 6 weeks   Minta Fair A, MD 05/05/2015

## 2015-05-05 NOTE — Discharge Summary (Signed)
Obstetric Discharge Summary Reason for Admission: induction of labor Prenatal Procedures: none Intrapartum Procedures: spontaneous vaginal delivery Postpartum Procedures: none Complications-Operative and Postpartum: none HEMOGLOBIN  Date Value Ref Range Status  05/03/2015 12.4 12.0 - 15.0 g/dL Final   HCT  Date Value Ref Range Status  05/03/2015 36.8 36.0 - 46.0 % Final    Physical Exam:  General: alert Lochia: appropriate Uterine Fundus: firm Incision: healing well DVT Evaluation: No evidence of DVT seen on physical exam.  Discharge Diagnoses: Term Pregnancy-delivered  Discharge Information: Date: 05/05/2015 Activity: pelvic rest Diet: routine Medications: Percocet Condition: improved Instructions: refer to practice specific booklet Discharge to: home Follow-up Information    Follow up with Kathreen CosierMARSHALL,BERNARD A, MD.   Specialty:  Obstetrics and Gynecology   Contact information:   [address] [place] [id number] [phone]       Newborn Data: Live born female  Birth Weight: 7 lb 4.2 oz (3295 g) APGAR: 9, 9  Home with mother.  MARSHALL,BERNARD A 05/05/2015, 6:32 AM

## 2015-05-05 NOTE — Progress Notes (Signed)
Patient ID: Cynthia Berg, female   DOB: [birth year], 30 y.o.   MRN: [id number] Postpartum day 2 Blood pressure 100/64 pulse 77 respiration 18 Fundus firm Lochia moderate Legs negative doing well

## 2015-05-05 NOTE — Lactation Note (Signed)
This note was copied from a baby's chart. Lactation Consultation Note; Experienced BF mom getting ready to latch baby when I went into room. Reports he has fed well through the night. Using football position now- states she doesn't like it but he is doing well with it.  Baby latched well and still nursing when I left room. Continues using comfort gels and reports they are helping nipples. No questions at present To call prn  Patient Name: Boy Glade LloydGracious Maners ZOXWR'UToday's Date: 05/05/2015 Reason for consult: Follow-up assessment   Maternal Data Formula Feeding for Exclusion: No Has patient been taught Hand Expression?: Yes Does the patient have breastfeeding experience prior to this delivery?: Yes  Feeding Feeding Type: Breast Fed  LATCH Score/Interventions Latch: Grasps breast easily, tongue down, lips flanged, rhythmical sucking.  Audible Swallowing: A few with stimulation  Type of Nipple: Everted at rest and after stimulation  Comfort (Breast/Nipple): Filling, red/small blisters or bruises, mild/mod discomfort  Problem noted: Mild/Moderate discomfort Interventions (Mild/moderate discomfort): Comfort gels;Hand expression  Hold (Positioning): No assistance needed to correctly position infant at breast.  LATCH Score: 8  Lactation Tools Discussed/Used     Consult Status Consult Status: Complete    Pamelia HoitWeeks, Fardowsa Authier D 05/05/2015, 8:19 AM

## 2015-08-15 ENCOUNTER — Emergency Department (HOSPITAL_COMMUNITY): Payer: Medicaid Other

## 2015-08-15 ENCOUNTER — Emergency Department (HOSPITAL_COMMUNITY)
Admission: EM | Admit: 2015-08-15 | Discharge: 2015-08-15 | Disposition: A | Discharge status: Home / Self Care | Payer: Medicaid Other | Attending: Emergency Medicine | Admitting: Emergency Medicine

## 2015-08-15 ENCOUNTER — Encounter (HOSPITAL_COMMUNITY): Payer: Self-pay | Admitting: Emergency Medicine

## 2015-08-15 DIAGNOSIS — R1110 Vomiting, unspecified: Secondary | ICD-10-CM | POA: Diagnosis not present

## 2015-08-15 DIAGNOSIS — J069 Acute upper respiratory infection, unspecified: Secondary | ICD-10-CM | POA: Insufficient documentation

## 2015-08-15 DIAGNOSIS — J9801 Acute bronchospasm: Secondary | ICD-10-CM | POA: Insufficient documentation

## 2015-08-15 DIAGNOSIS — R05 Cough: Secondary | ICD-10-CM | POA: Diagnosis present

## 2015-08-15 MED ORDER — AEROCHAMBER Z-STAT PLUS/MEDIUM MISC
1.0000 | Freq: Once | Status: AC
  Administered 2015-08-15: 1

## 2015-08-15 MED ORDER — ALBUTEROL SULFATE HFA 108 (90 BASE) MCG/ACT IN AERS
2.0000 | INHALATION_SPRAY | Freq: Once | RESPIRATORY_TRACT | Status: AC
  Administered 2015-08-15: 2 via RESPIRATORY_TRACT
  Filled 2015-08-15: qty 6.7

## 2015-08-15 MED ORDER — DEXTROMETHORPHAN-GUAIFENESIN 15-400 MG PO TABS: 1.0000 | ORAL_TABLET | ORAL | Status: DC

## 2015-08-15 NOTE — Discharge Instructions (Signed)
Upper Respiratory Infection, Adult  Most upper respiratory infections (URIs) are a viral infection of the air passages leading to the lungs. A URI affects the nose, throat, and upper air passages. The most common type of URI is nasopharyngitis and is typically referred to as "the common cold."  URIs run their course and usually go away on their own. Most of the time, a URI does not require medical attention, but sometimes a bacterial infection in the upper airways can follow a viral infection. This is called a secondary infection. Sinus and middle ear infections are common types of secondary upper respiratory infections.  Bacterial pneumonia can also complicate a URI. A URI can worsen asthma and chronic obstructive pulmonary disease (COPD). Sometimes, these complications can require emergency medical care and may be life threatening.   CAUSES  Almost all URIs are caused by viruses. A virus is a type of germ and can spread from one person to another.   RISKS FACTORS  You may be at risk for a URI if:    You smoke.    You have chronic heart or lung disease.   You have a weakened defense (immune) system.    You are very young or very old.    You have nasal allergies or asthma.   You work in crowded or poorly ventilated areas.   You work in health care facilities or schools.  SIGNS AND SYMPTOMS   Symptoms typically develop 2-3 days after you come in contact with a cold virus. Most viral URIs last 7-10 days. However, viral URIs from the influenza virus (flu virus) can last 14-18 days and are typically more severe. Symptoms may include:    Runny or stuffy (congested) nose.    Sneezing.    Cough.    Sore throat.    Headache.    Fatigue.    Fever.    Loss of appetite.    Pain in your forehead, behind your eyes, and over your cheekbones (sinus pain).   Muscle aches.   DIAGNOSIS   Your health care provider may diagnose a URI by:   Physical exam.   Tests to check that your symptoms are not due to  another condition such as:   Strep throat.   Sinusitis.   Pneumonia.   Asthma.  TREATMENT   A URI goes away on its own with time. It cannot be cured with medicines, but medicines may be prescribed or recommended to relieve symptoms. Medicines may help:   Reduce your fever.   Reduce your cough.   Relieve nasal congestion.  HOME CARE INSTRUCTIONS    Take medicines only as directed by your health care provider.    Gargle warm saltwater or take cough drops to comfort your throat as directed by your health care provider.   Use a warm mist humidifier or inhale steam from a shower to increase air moisture. This may make it easier to breathe.   Drink enough fluid to keep your urine clear or pale yellow.    Eat soups and other clear broths and maintain good nutrition.    Rest as needed.    Return to work when your temperature has returned to normal or as your health care provider advises. You may need to stay home longer to avoid infecting others. You can also use a face mask and careful hand washing to prevent spread of the virus.   Increase the usage of your inhaler if you have asthma.    Do not   use any tobacco products, including cigarettes, chewing tobacco, or electronic cigarettes. If you need help quitting, ask your health care provider.  PREVENTION   The best way to protect yourself from getting a cold is to practice good hygiene.    Avoid oral or hand contact with people with cold symptoms.    Wash your hands often if contact occurs.   There is no clear evidence that vitamin C, vitamin E, echinacea, or exercise reduces the chance of developing a cold. However, it is always recommended to get plenty of rest, exercise, and practice good nutrition.   SEEK MEDICAL CARE IF:    You are getting worse rather than better.    Your symptoms are not controlled by medicine.    You have chills.   You have worsening shortness of breath.   You have brown or red mucus.   You have yellow or brown nasal  discharge.   You have pain in your face, especially when you bend forward.   You have a fever.   You have swollen neck glands.   You have pain while swallowing.   You have white areas in the back of your throat.  SEEK IMMEDIATE MEDICAL CARE IF:    You have severe or persistent:    Headache.    Ear pain.    Sinus pain.    Chest pain.   You have chronic lung disease and any of the following:    Wheezing.    Prolonged cough.    Coughing up blood.    A change in your usual mucus.   You have a stiff neck.   You have changes in your:    Vision.    Hearing.    Thinking.    Mood.  MAKE SURE YOU:    Understand these instructions.   Will watch your condition.   Will get help right away if you are not doing well or get worse.     This information is not intended to replace advice given to you by your health care provider. Make sure you discuss any questions you have with your health care provider.     Document Released: 07/21/2000 Document Revised: 06/11/2014 Document Reviewed: 05/02/2013  Elsevier Interactive Patient Education 2016 [employer]    Bronchospasm, Adult  A bronchospasm is a spasm or tightening of the airways going into the lungs. During a bronchospasm breathing becomes more difficult because the airways get smaller. When this happens there can be coughing, a whistling sound when breathing (wheezing), and difficulty breathing. Bronchospasm is often associated with asthma, but not all patients who experience a bronchospasm have asthma.  CAUSES   A bronchospasm is caused by inflammation or irritation of the airways. The inflammation or irritation may be triggered by:    Allergies (such as to animals, pollen, food, or mold). Allergens that cause bronchospasm may cause wheezing immediately after exposure or many hours later.    Infection. Viral infections are believed to be the most common cause of bronchospasm.    Exercise.    Irritants (such as pollution, cigarette smoke, strong odors, aerosol  sprays, and paint fumes).    Weather changes. Winds increase molds and pollens in the air. Rain refreshes the air by washing irritants out. Cold air may cause inflammation.    Stress and emotional upset.   SIGNS AND SYMPTOMS    Wheezing.    Excessive nighttime coughing.    Frequent or severe coughing with a simple cold.      Chest tightness.    Shortness of breath.   DIAGNOSIS   Bronchospasm is usually diagnosed through a history and physical exam. Tests, such as chest X-rays, are sometimes done to look for other conditions.  TREATMENT    Inhaled medicines can be given to open up your airways and help you breathe. The medicines can be given using either an inhaler or a nebulizer machine.   Corticosteroid medicines may be given for severe bronchospasm, usually when it is associated with asthma.  HOME CARE INSTRUCTIONS    Always have a plan prepared for seeking medical care. Know when to call your health care provider and local emergency services (911 in the [place] Know where you can access local emergency care.   Only take medicines as directed by your health care provider.   If you were prescribed an inhaler or nebulizer machine, ask your health care provider to explain how to use it correctly. Always use a spacer with your inhaler if you were given one.   It is necessary to remain calm during an attack. Try to relax and breathe more slowly.   Control your home environment in the following ways:     Change your heating and air conditioning filter at least once a month.     Limit your use of fireplaces and wood stoves.    Do not smoke and do not allow smoking in your home.     Avoid exposure to perfumes and fragrances.     Get rid of pests (such as roaches and mice) and their droppings.     Throw away plants if you see mold on them.     Keep your house clean and dust free.     Replace carpet with wood, tile, or vinyl flooring. Carpet can trap dander and dust.     Use allergy-proof  pillows, mattress covers, and box spring covers.     Wash bed sheets and blankets every week in hot water and dry them in a dryer.     Use blankets that are made of polyester or cotton.     Wash hands frequently.  SEEK MEDICAL CARE IF:    You have muscle aches.    You have chest pain.    The sputum changes from clear or white to yellow, green, gray, or bloody.    The sputum you cough up gets thicker.    There are problems that may be related to the medicine you are given, such as a rash, itching, swelling, or trouble breathing.   SEEK IMMEDIATE MEDICAL CARE IF:    You have worsening wheezing and coughing even after taking your prescribed medicines.    You have increased difficulty breathing.    You develop severe chest pain.  MAKE SURE YOU:    Understand these instructions.   Will watch your condition.   Will get help right away if you are not doing well or get worse.     This information is not intended to replace advice given to you by your health care provider. Make sure you discuss any questions you have with your health care provider.     Document Released: 01/28/2003 Document Revised: 02/15/2014 Document Reviewed: 07/17/2012  Elsevier Interactive Patient Education 2016 [employer]

## 2015-08-15 NOTE — ED Provider Notes (Signed)
CSN: [id number]     Arrival date & time 08/15/15  1403 History  By signing my name below, I, Cynthia Berg, attest that this documentation has been prepared under the direction and in the presence of Arthor CaptainAbigail Biridiana Twardowski, PA-C. Electronically Signed: Placido SouLogan Berg, ED Scribe. 08/15/2015. 3:28 PM.   Chief Complaint  Patient presents with  . Cough   The history is provided by the patient. No language interpreter was used.    HPI Comments: Cynthia Berg is a 30 y.o. female who presents to the Emergency Department complaining of constant, moderate, productive cough x 2 days. Pt states she feels as if she has a "tickle" in her chest prior to experiencing worsening bouts of coughing. She reports associated post tussive emesis and sore throat and states her symptoms are worse at night. Pt reports her husband is sick with similar symptoms. Pt denies having taken anything for her symptoms. She is currently breastfeeding. Pt denies any other associated symptoms at this time.   Past Medical History  Diagnosis Date  . Abnormal Pap smear   . UTI (urinary tract infection) 2012  . NVD (normal vaginal delivery) 08/25/2010  . Medical history non-contributory   . Vaginal Pap smear, abnormal    Past Surgical History  Procedure Laterality Date  . Cesarean section      C/S x 1   Family History  Problem Relation Age of Onset  . Cancer Mother     breast   Social History  Substance Use Topics  . Smoking status: Never Smoker   . Smokeless tobacco: Never Used  . Alcohol Use: No   OB History    Gravida Para Term Preterm AB TAB SAB Ectopic Multiple Living   4 4 2 2      0 4     Review of Systems  Constitutional: Negative for fever and chills.  HENT: Positive for sore throat.   Respiratory: Positive for cough.   Gastrointestinal: Positive for vomiting. Negative for nausea.   Allergies  Review of patient's allergies indicates no known allergies.  Home Medications   Prior to Admission medications    Not on File   BP 128/92 mmHg  Pulse 95  Temp(Src) 98 F (36.7 C) (Oral)  Resp 18  SpO2 98%    Physical Exam  Constitutional: She is oriented to person, place, and time. She appears well-developed and well-nourished.  HENT:  Head: Normocephalic and atraumatic.  Eyes: EOM are normal.  Neck: Normal range of motion.  Cardiovascular: Normal rate.   Pulmonary/Chest: Effort normal and breath sounds normal. No respiratory distress. She has no wheezes. She has no rales.  Abdominal: Soft.  Musculoskeletal: Normal range of motion.  Neurological: She is alert and oriented to person, place, and time.  Skin: Skin is warm and dry.  Psychiatric: She has a normal mood and affect.  Nursing note and vitals reviewed.   ED Course  Procedures  DIAGNOSTIC STUDIES: Oxygen Saturation is 98% on RA, normal by my interpretation.    COORDINATION OF CARE: 3:21 PM Discussed next steps with pt. Pt verbalized understanding and is agreeable with the plan.   Labs Review Labs Reviewed - No data to display  Imaging Review Dg Chest 2 View  08/15/2015  CLINICAL DATA:  Coughing gagging with sore throat since Wednesday. EXAM: CHEST  2 VIEW COMPARISON:  None. FINDINGS: Cardiomediastinal silhouette is normal in size and configuration. Lungs are clear. Lung volumes are normal. No evidence of pneumonia. No pleural effusion. No pneumothorax. Osseous  and soft tissue structures about the chest are unremarkable. IMPRESSION: No active cardiopulmonary disease.  No evidence of pneumonia. Electronically Signed   By: Bary RichardStan  Maynard M.D.   On: 08/15/2015 14:49   I have personally reviewed and evaluated these images as part of my medical decision-making.   EKG Interpretation None      MDM   Final diagnoses:  None    Pt symptoms consistent with URI. CXR negative for acute infiltrate. Pt will be discharged with symptomatic treatment.  Pt was given albuterol for bronchospasms. Consulted with pharmacy regarding  appropriate cough suppressant medication given pt is currently breastfeeding. Discussed return precautions.  Pt is hemodynamically stable & in NAD prior to discharge.    Medication List    TAKE these medications        Dextromethorphan-Guaifenesin 15-400 MG Tabs  Take 1 tablet by mouth every 4 (four) hours.        I personally performed the services described in this documentation, which was scribed in my presence. The recorded information has been reviewed and is accurate.       Arthor Captainbigail Ismeal Heider, PA-C 08/15/15 2150  Mancel BaleElliott Wentz, MD 08/16/15 1126

## 2015-08-15 NOTE — ED Notes (Signed)
PT DISCHARGED. INSTRUCTIONS AND PRESCRIPTION GIVEN. AAOX4. PT IN NO APPARENT DISTRESS OR PAIN. THE OPPORTUNITY TO ASK QUESTIONS WAS PROVIDED. 

## 2015-08-15 NOTE — ED Notes (Signed)
Pt reports sore throat and productive cough. 

## 2018-02-10 IMAGING — CR DG CHEST 2V
2 series · 2 of 2 positions shown · non-contrast
Comparison: None.

CLINICAL DATA: Coughing gagging with sore throat since [REDACTED].

EXAM:
CHEST  2 VIEW

[w chest pa]
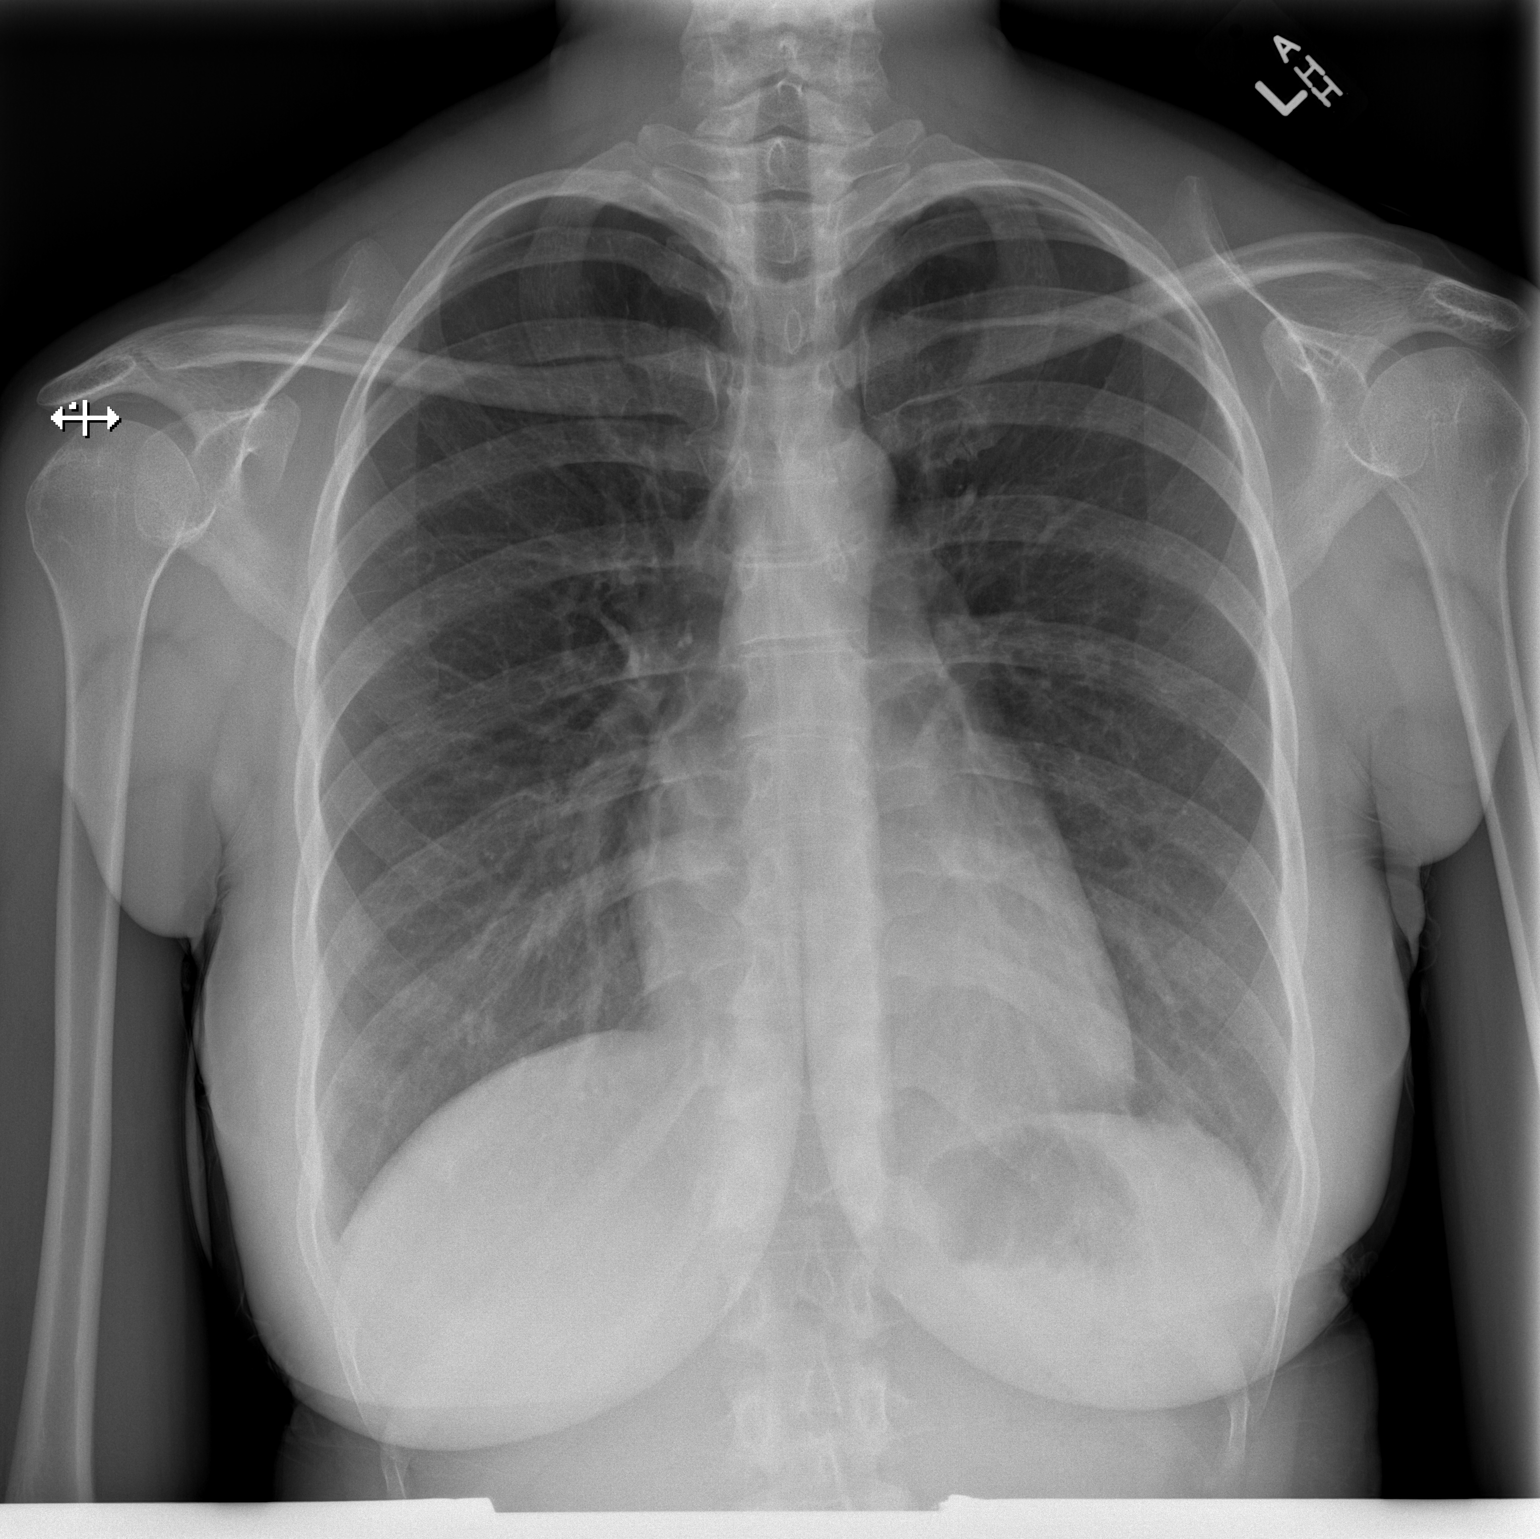

[w chest lat]
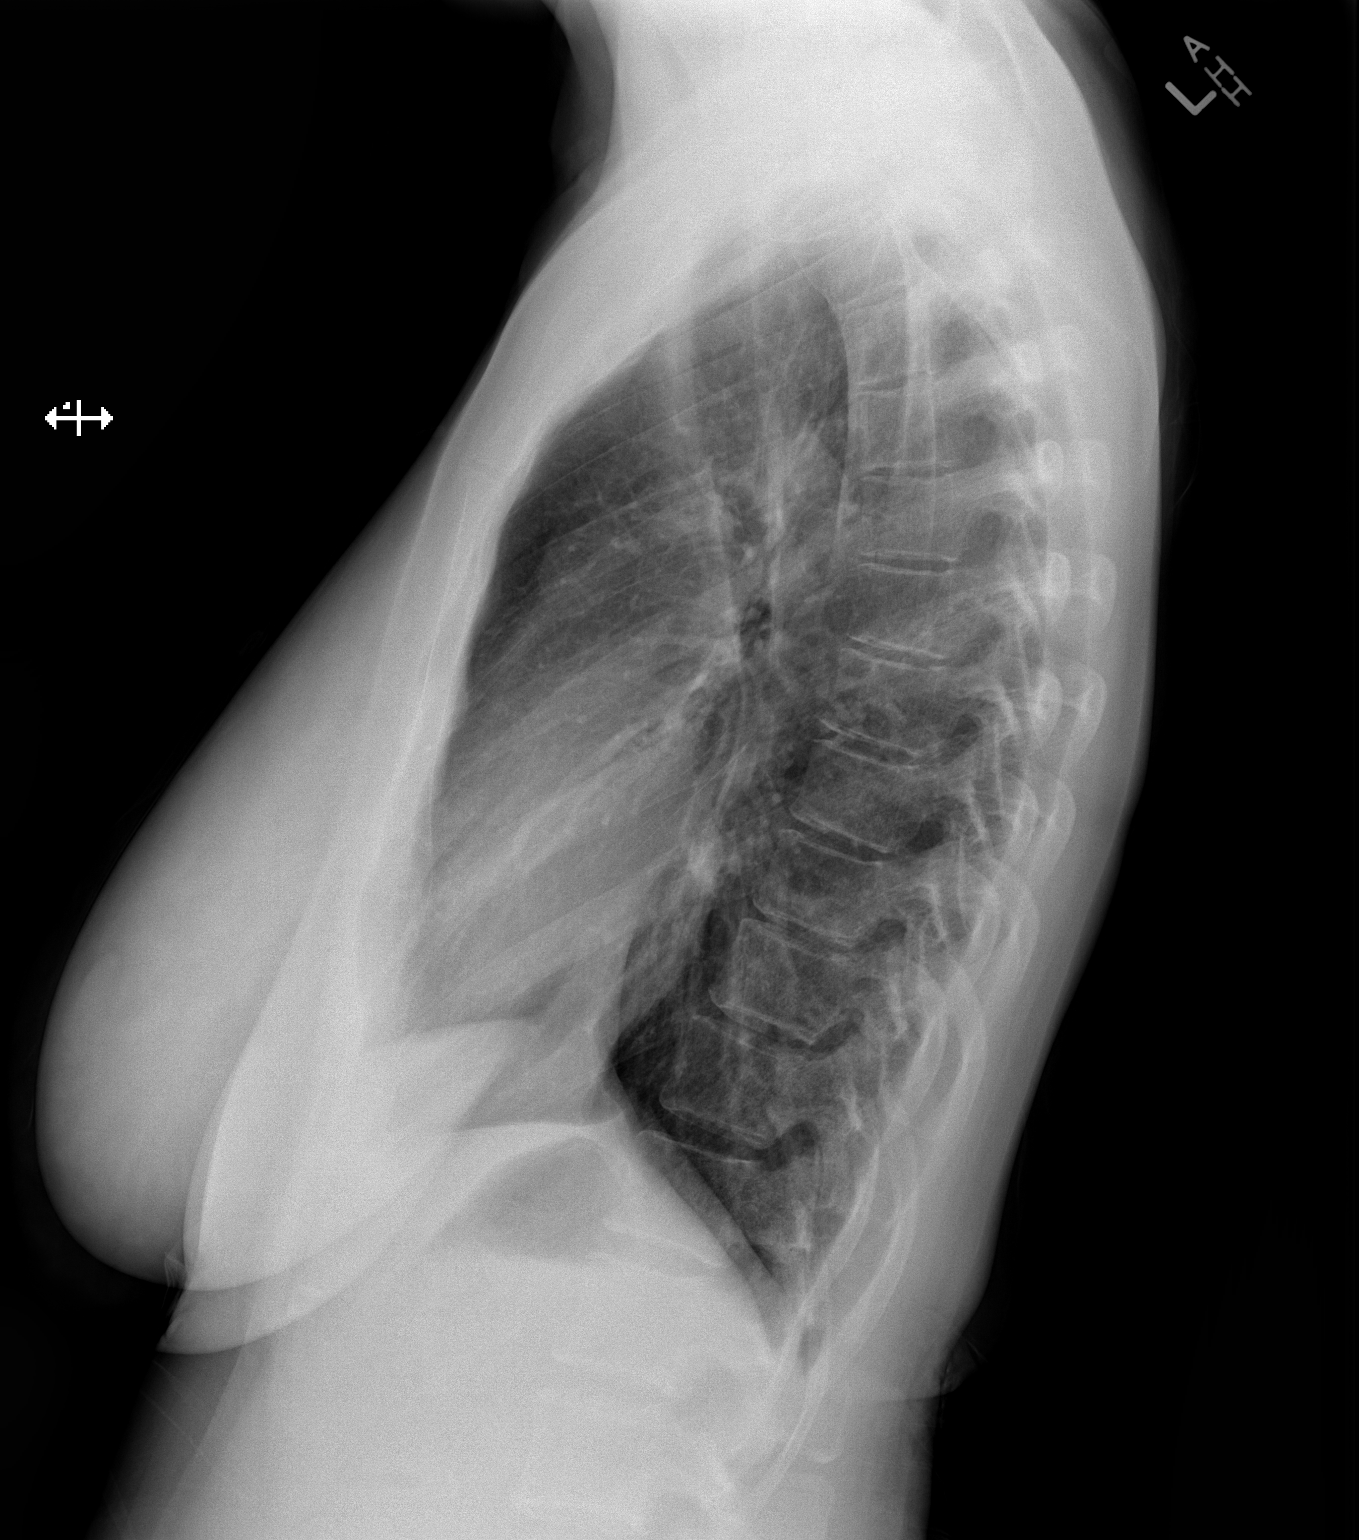

[2 of 2 positions shown; findings below may reference images not displayed]

FINDINGS: Cardiomediastinal silhouette is normal in size and configuration.
Lungs are clear. Lung volumes are normal. No evidence of pneumonia.
No pleural effusion. No pneumothorax.

Osseous and soft tissue structures about the chest are unremarkable.
IMPRESSION: No active cardiopulmonary disease.  No evidence of pneumonia.

## 2020-07-15 ENCOUNTER — Ambulatory Visit: Payer: Self-pay | Admitting: Podiatry

## 2021-11-05 ENCOUNTER — Ambulatory Visit: Payer: Self-pay | Admitting: Podiatry

## 2021-11-18 ENCOUNTER — Ambulatory Visit: Payer: Medicaid Other | Admitting: Podiatry

## 2022-01-27 ENCOUNTER — Ambulatory Visit (INDEPENDENT_AMBULATORY_CARE_PROVIDER_SITE_OTHER): Payer: Medicaid Other

## 2022-01-27 ENCOUNTER — Ambulatory Visit (INDEPENDENT_AMBULATORY_CARE_PROVIDER_SITE_OTHER): Payer: Medicaid Other | Admitting: Podiatry

## 2022-01-27 ENCOUNTER — Encounter: Payer: Self-pay | Admitting: Podiatry

## 2022-01-27 VITALS — BP 132/71 | HR 73

## 2022-01-27 DIAGNOSIS — M2041 Other hammer toe(s) (acquired), right foot: Secondary | ICD-10-CM

## 2022-01-27 DIAGNOSIS — M2042 Other hammer toe(s) (acquired), left foot: Secondary | ICD-10-CM | POA: Diagnosis not present

## 2022-01-27 NOTE — Progress Notes (Signed)
  Subjective:  Patient ID: Cynthia Berg, female    DOB: 25-Sep-1985,  MRN: [id number] HPI Chief Complaint  Patient presents with   Toe Pain    Toes bilateral - HT deformity x years, shoes are rubbing causing corns, tender, looking for options   New Patient (Initial Visit)    36 y.o. female presents with the above complaint.   ROS: Denies fever chills nausea vomit muscle aches pains calf pain back pain chest pain shortness of breath.  Past Medical History:  Diagnosis Date   Abnormal Pap smear    Medical history non-contributory    NVD (normal vaginal delivery) 08/25/2010   UTI (urinary tract infection) 2012   Vaginal Pap smear, abnormal    Past Surgical History:  Procedure Laterality Date   CESAREAN SECTION     C/S x 1   No current outpatient medications on file.  No Known Allergies Review of Systems Objective:   Vitals:   01/27/22 0947  BP: 132/71  Pulse: 73    General: Well developed, nourished, in no acute distress, alert and oriented x3   Dermatological: Skin is warm, dry and supple bilateral. Nails x 10 are well maintained; remaining integument appears unremarkable at this time. There are no open sores, no preulcerative lesions, no rash or signs of infection present.  Hyper and hypopigmentation of the PIPJ's and the DIPJ's dorsal aspect of the bilateral foot lesser digits.  Most likely due to tight shoe gear.  Vascular: Dorsalis Pedis artery and Posterior Tibial artery pedal pulses are 2/4 bilateral with immedate capillary fill time. Pedal hair growth present. No varicosities and no lower extremity edema present bilateral.   Neruologic: Grossly intact via light touch bilateral. Vibratory intact via tuning fork bilateral. Protective threshold with Semmes Wienstein monofilament intact to all pedal sites bilateral. Patellar and Achilles deep tendon reflexes 2+ bilateral. No Babinski or clonus noted bilateral.   Musculoskeletal: No gross boney pedal deformities  bilateral. No pain, crepitus, or limitation noted with foot and ankle range of motion bilateral. Muscular strength 5/5 in all groups tested bilateral.  Gait: Unassisted, Nonantalgic.    Radiographs:  Radiographs taken today demonstrate osseously mature foot is a narrow foot with mild pes planus.  Hallux valgus deformities noted bilaterally.  Adductovarus rotated hammertoe deformity fifth bilateral.  Assessment & Plan:   Assessment: Mild hyper and hypopigmentation at the PIPJ's and DIPJ's of the lesser toes secondary to shoe gear.  She states that some of this has improved over the years.  Plan: Discussed etiology pathology conservative versus surgical therapies were discussed fading cream and the fact that surgery to these would leave a worse scar than what she has had already and that she really does not have severe hammertoe deformities most of this is due to the shoes that she was wearing in the way that she holds them on her feet.  She agrees with this and states that they do appear different as she has not worn dress shoes in quite some time.     Danyela Posas T. [place], [place]
